# Patient Record
Sex: Male | Born: 1960
Health system: Southern US, Community
[De-identification: ages and names within clinical notes are randomized; demographics above are authoritative.]

## PROBLEM LIST (undated history)

## (undated) DIAGNOSIS — L409 Psoriasis, unspecified: Secondary | ICD-10-CM

## (undated) HISTORY — PX: NOSE SURGERY: SHX723

---

## 2001-03-23 ENCOUNTER — Other Ambulatory Visit: Admission: RE | Admit: 2001-03-23 | Discharge: 2001-03-23 | Payer: Self-pay | Admitting: Dermatology

## 2002-09-12 ENCOUNTER — Ambulatory Visit (HOSPITAL_COMMUNITY): Admission: RE | Admit: 2002-09-12 | Discharge: 2002-09-12 | Payer: Self-pay | Admitting: Internal Medicine

## 2005-12-29 ENCOUNTER — Ambulatory Visit: Payer: Self-pay | Admitting: Orthopedic Surgery

## 2007-04-09 ENCOUNTER — Encounter: Payer: Self-pay | Admitting: Emergency Medicine

## 2007-04-10 ENCOUNTER — Ambulatory Visit: Payer: Self-pay | Admitting: Cardiology

## 2007-04-10 ENCOUNTER — Ambulatory Visit: Payer: Self-pay | Admitting: *Deleted

## 2007-04-10 ENCOUNTER — Observation Stay (HOSPITAL_COMMUNITY): Admission: EM | Admit: 2007-04-10 | Discharge: 2007-04-10 | Payer: Self-pay | Admitting: Cardiology

## 2007-04-11 ENCOUNTER — Ambulatory Visit: Payer: Self-pay

## 2007-04-21 ENCOUNTER — Ambulatory Visit (HOSPITAL_COMMUNITY): Admission: RE | Admit: 2007-04-21 | Discharge: 2007-04-21 | Payer: Self-pay | Admitting: Internal Medicine

## 2007-07-20 ENCOUNTER — Emergency Department (HOSPITAL_COMMUNITY): Admission: EM | Admit: 2007-07-20 | Discharge: 2007-07-21 | Payer: Self-pay | Admitting: Emergency Medicine

## 2008-05-13 ENCOUNTER — Encounter: Payer: Self-pay | Admitting: Internal Medicine

## 2008-05-13 ENCOUNTER — Ambulatory Visit (HOSPITAL_COMMUNITY): Admission: RE | Admit: 2008-05-13 | Discharge: 2008-05-13 | Payer: Self-pay | Admitting: Internal Medicine

## 2008-05-13 ENCOUNTER — Ambulatory Visit: Payer: Self-pay | Admitting: Internal Medicine

## 2008-12-16 IMAGING — CT CT PELVIS W/ CM
1 of 2 series · 14 of 32 positions shown, 19 images · IV contrast (Omnipaque 300)
Comparison: 04/21/2007

CT ABDOMEN

CLINICAL DATA: Epigastric pain.  Left lower quadrant abdominal
pain.

CT ABDOMEN AND PELVIS WITH CONTRAST
TECHNIQUE: Multidetector CT imaging of the abdomen and pelvis was
performed using the standard protocol following bolus
administration of intravenous contrast.
Contrast: 100 ml Mmnipaque-7KK

[Series 2: abd_pel 5.0 b40f · axial · 0.73mm/px · z∈[-459,+6]mm · 14 of 103 slices shown, 19 images]
[im 5/103  soft-tissue]
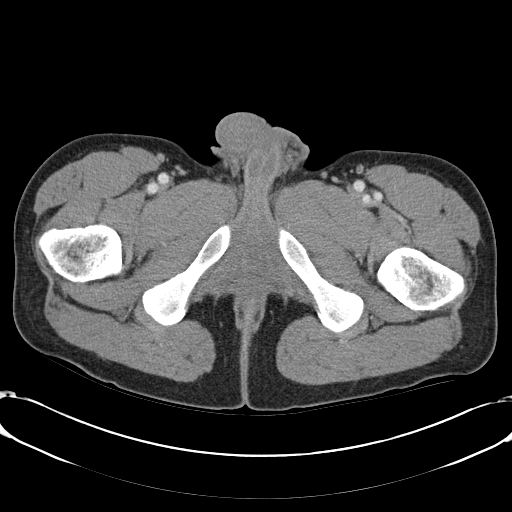
[im 5/103  bone]
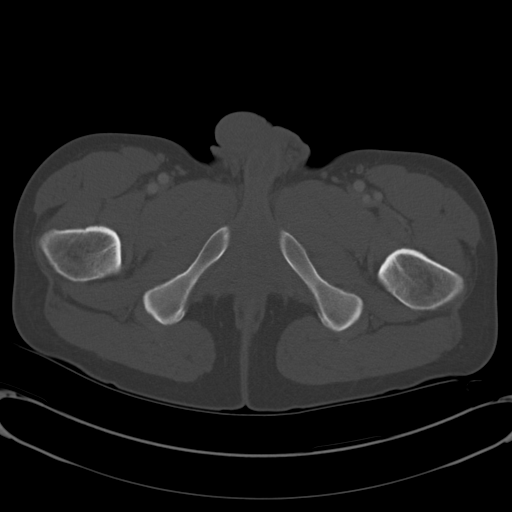
[im 15/103  soft-tissue]
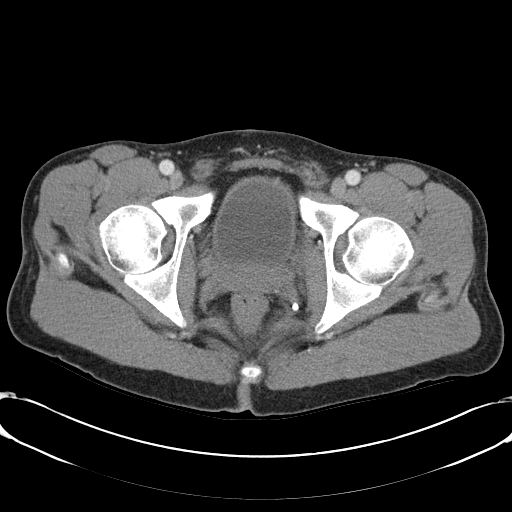
[im 20/103  soft-tissue]
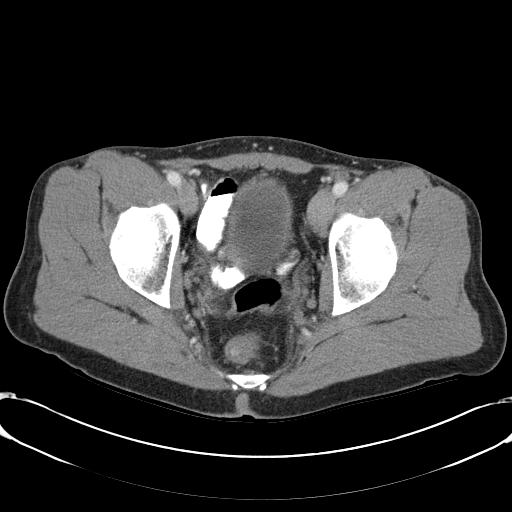
[im 30/103  soft-tissue]
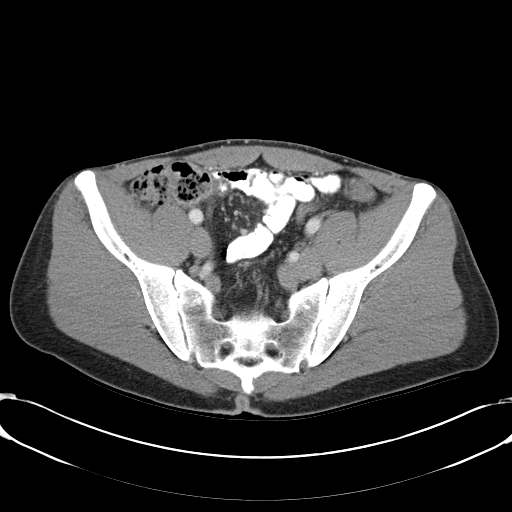
[im 35/103  soft-tissue]
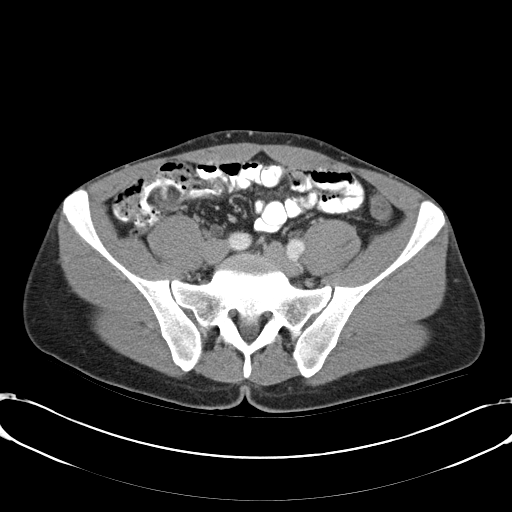
[im 44/103  soft-tissue]
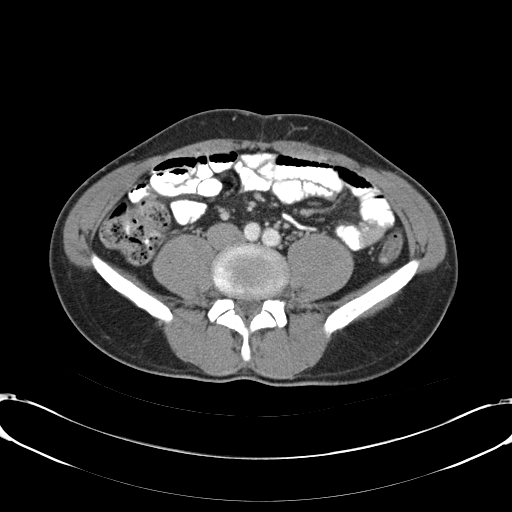
[im 54/103  soft-tissue]
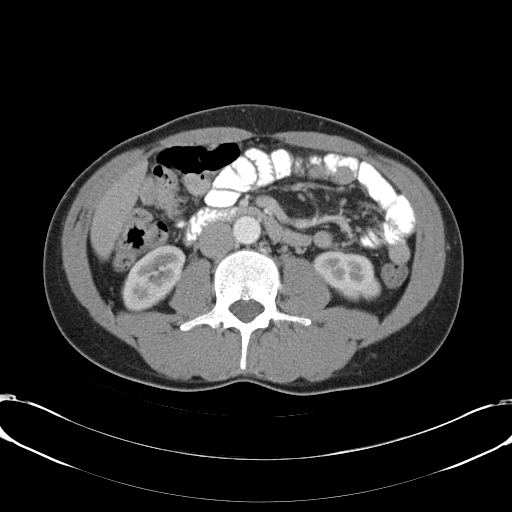
[im 59/103  soft-tissue]
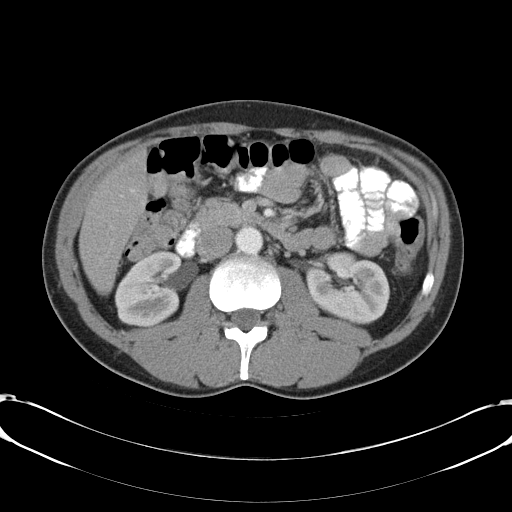
[im 69/103  soft-tissue]
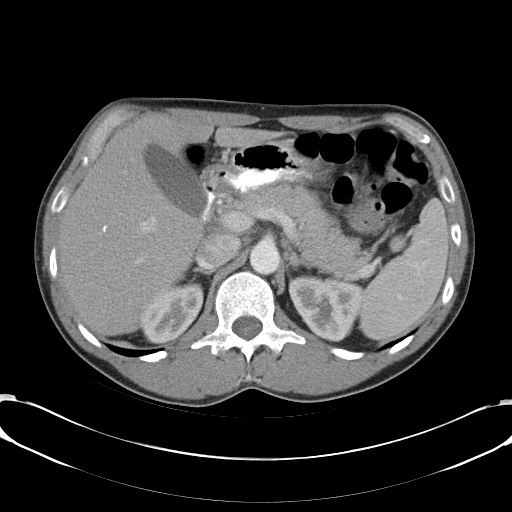
[im 69/103  bone]
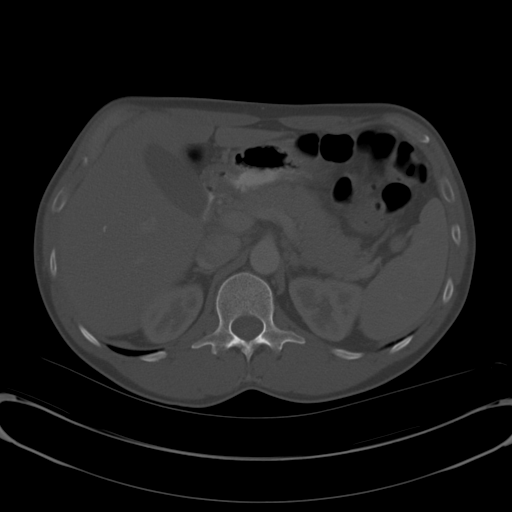
[im 73/103  soft-tissue]
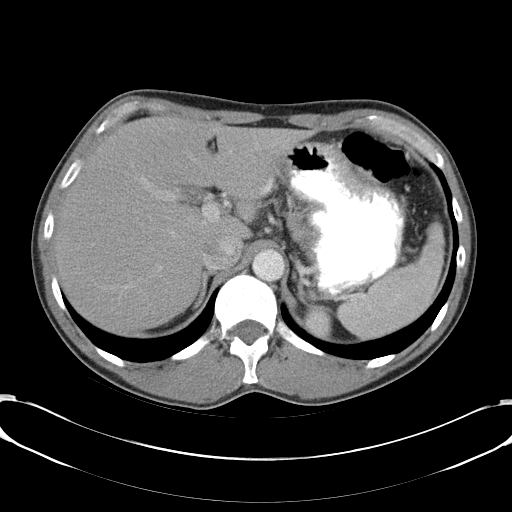
[im 83/103  soft-tissue]
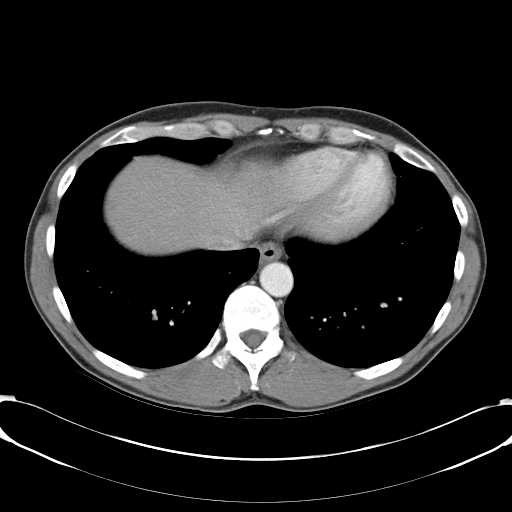
[im 83/103  lung]
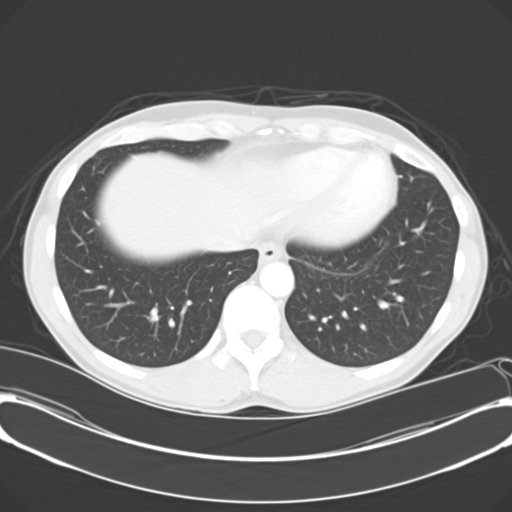
[im 88/103  soft-tissue]
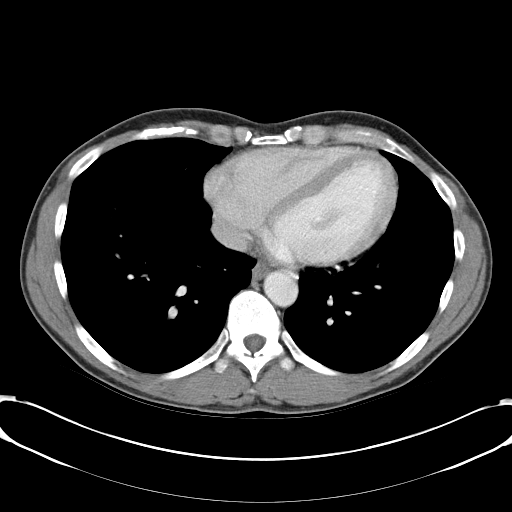
[im 88/103  lung]
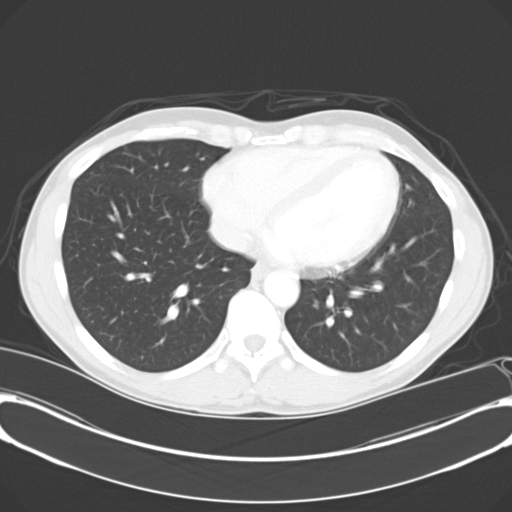
[im 93/103  lung]
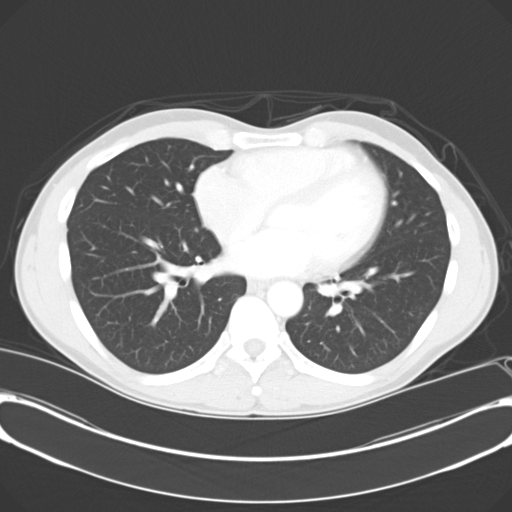
[im 98/103  soft-tissue]
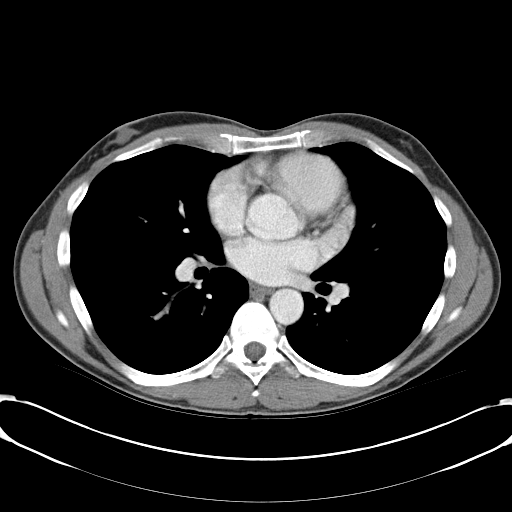
[im 98/103  lung]
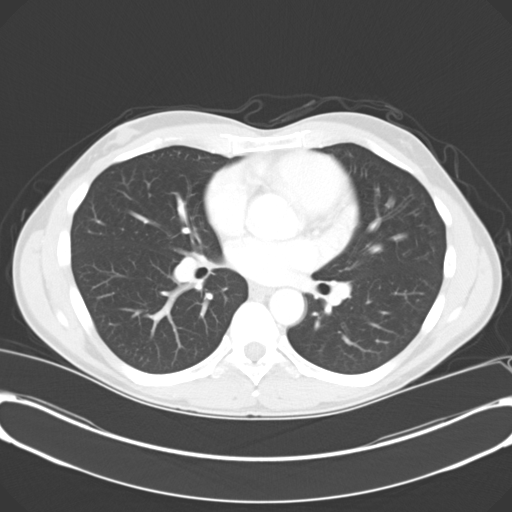

[14 of 32 positions shown; findings below may reference images not displayed]

FINDINGS: A 2 mm pleural-based nodule on image 26 of series 3
appears stable compared to 04/21/2007. Based on current guidelines,
no further workup is warranted.

A stable small calcification of the right hepatic lobe as shown on
image 35 of series 2.  The liver appears otherwise unremarkable.
The gallbladder, spleen, pancreas, and adrenal glands appear
normal.

The kidneys appear normal.  No pathologic retroperitoneal
adenopathy is evident.  The stomach and duodenum appear grossly
unremarkable.

No dilated upper abdominal bowel is noted.  No acute upper
abdominal findings are identified.
IMPRESSION: 1.  No significant upper abdominal abnormalities identified explain
the patient's epigastric and left lower quadrant pain.

CT PELVIS
FINDINGS: The terminal ileum appears grossly unremarkable.
Contrast medium makes its way through to the proximal colon.  Much
of the distal colon is relatively collapsed, limiting assessment
for colonic wall thickening.  No dilated small bowel is identified.

No pathologic inguinal or pelvic adenopathy is identified.
Borderline dilatation of the right mid ureter just proximal to the
iliac crossover is likely physiologic.  Urinary bladder appears
unremarkable.  No free pelvic fluid identified.
IMPRESSION: 1.  No acute pelvic findings to explain the patient's left lower
quadrant abdominal pain.

## 2010-04-20 ENCOUNTER — Encounter: Payer: Self-pay | Admitting: Internal Medicine

## 2010-04-22 ENCOUNTER — Ambulatory Visit (HOSPITAL_COMMUNITY): Admission: RE | Admit: 2010-04-22 | Discharge: 2010-04-22 | Payer: Self-pay | Admitting: Internal Medicine

## 2010-04-22 ENCOUNTER — Ambulatory Visit: Payer: Self-pay | Admitting: Internal Medicine

## 2010-09-22 NOTE — Letter (Signed)
Summary: [facility] ORDERS  [facility] ORDERS   Imported By: Rexene Alberts 04/20/2010 16:29:08  _____________________________________________________________________  External Attachment:    Type:   Image     Comment:   External Document

## 2011-01-05 NOTE — Op Note (Signed)
NAME:  Gary Fischer, Gary A.             ACCOUNT NO.:  [id number]   MEDICAL RECORD NO.:  [id number]         PATIENT TYPE:  PAMB   LOCATION:  DAY                           FACILITY:  [facility]   PHYSICIAN:  R. Roetta Sessions, M.D. DATE OF BIRTH:  28-Jul-1961   DATE OF PROCEDURE:  05/13/2008  DATE OF DISCHARGE:                               OPERATIVE REPORT    ESOPHAGOGASTRODUODENOSCOPY WITH BIOPSY   INDICATIONS FOR PROCEDURE:  A 50 year old gentleman with, I think, a 1-  month history of somewhat vague intermittent upper and lower abdominal  pain, which at times awakens him from a sound sleep.  In the last month,  he has been treated with a round of doxycycline for skin infection and  subsequently was seen by Dr. [name], and then treated with Cipro and  subsequently Levaquin for prostatitis.  He has not had any melena or  blood per rectum.  If anything, he has been a bit more constipated  recently.  He really has not had any nausea, vomiting, odynophagia,  dysphagia, or reflux symptoms.  No NSAID use.  Eating does not affect  the pain and symptoms have insidiously worsened in last several days.   The patient called me on May 10, 2008.  We discussed his symptoms  and I suggested he go on a probiotic in the way of Sustenex.  We  provided him with samples at the office.  I asked him also to cut back  on his intake of caffeinated and carbonated soft drinks, the intake of  which has been significant recently.   He had already started taking Protonix 40 mg orally daily on his own.  I  saw him early in short stay and we obtained blood work including a CBC  and Chem-20, which is completely normal except for slightly elevated  blood sugar of 103.  Amylase and lipase also were normal.  Because of  his ongoing symptoms particularly with the upper abdominal complaint of  being awakened in the middle of the night, we are proceeding with a  diagnostic EGD.  This approach has been reviewed with the  patient at  length.  Risks, benefits, alternatives, and limitations have been  discussed.  Please see the documentation in the medical record.   PROCEDURE NOTE:  O2 saturation, blood pressure, pulse, and respirations  were monitored throughout the entire procedure.  Conscious sedation,  Versed 4 mg IV, Demerol 75 mg IV, Zofran 4 mg IV, and prophylaxis for  postprocedure nausea.  Cetacaine spray for topical pharyngeal  anesthesia.   INSTRUMENT:  Pentax video chip system.   FINDINGS:  Examination of the tubular esophagus revealed no mucosal  abnormalities.  EG junction was easily traversed.  Stomach:  Gastric  cavity was completely empty and insufflated very well with air.  Thorough examination of gastric mucosa including retroflexion at the  proximal stomach and esophagogastric junction demonstrated a small  hiatal hernia and a couple of areas of very, very fine petechiae in the  antrum and body.  There was no ulcer, erosion, infiltrating process, or  other evidence of neoplasm.  Pylorus  was patent and easily traversed.  Examination of the bulb, second portion revealed no abnormalities.  Therapeutic/diagnostic maneuvers performed:  Single biopsy of the antrum  and one of the body were taken for histologic study/to rule out H.  pylori.  The patient tolerated the procedure well and was reactive in  Endoscopy.   IMPRESSION:  Normal esophagus, small hiatal hernia, area of minimal fine  petechiae in the antrum and body of very doubtful clinical significance;  otherwise unremarkable gastric mucosa, status post biopsy, patent  pylorus, and normal D1 and D2.   I see no explanation for this patient's symptoms based on today's  endoscopic evaluation.   RECOMMENDATIONS:  1. We will go ahead and proceed with an abdominal and pelvic CT with      IV and oral contrast to further evaluate, and  follow up on      biopsies.  2. Further recommendations to follow.      Jonathon Bellows, M.D.   Electronically Signed     RMR/MEDQ  D:  05/13/2008  T:  05/14/2008  Job:  [id number]   cc:   Kingsley Callander. Ouida Sills, MD  Fax: [phone]

## 2011-01-05 NOTE — Discharge Summary (Signed)
NAMEARMANDO, Gary Fischer                ACCOUNT NO.:  [id number]   MEDICAL RECORD NO.:  [id number]          PATIENT TYPE:  OBV   LOCATION:  2008                         FACILITY:  [facility]   PHYSICIAN:  Rollene Rotunda, MD, FACCDATE OF BIRTH:  [birth year]   DATE OF ADMISSION:  04/10/2007  DATE OF DISCHARGE:  04/10/2007                               DISCHARGE SUMMARY   PROCEDURE:  Two-dimensional echocardiogram.   DISCHARGE DIAGNOSES:  1. Chest pain, cardiac enzymes negative for myocardial infarction and      outpatient stress test pending.  2. Recent flu-like illness, currently undergoing treatment for [facility] Spotted Fever with titers for Lyme disease, Ehrlichia and      [place] Spotted Fever pending.  3. Unknown lipid status, no lipid profile performed this admission.   Time of discharge 36 minutes.   HOSPITAL COURSE:  [name] is a 50 year old male with no previous  history of coronary artery disease.  He had an episode of chest pain on  the day of admission and went to [facility] where there was  concern for a cardiac origin of his symptoms, so he was transferred to  [facility] and admitted.   His cardiac enzymes were negative for MI.  A TSH was within normal  limits at 5.375.  A nonfasting blood sugar was minimally elevated to  105.  D-dimer was within normal limits at 0.22.  Other laboratory values  including a CBC and CMET were all within normal limits.   The chest pain resolved.  An echocardiogram showed an EF of 55% with no  regional wall motion abnormalities and normal wall thickness.  PAS was  17.  The aortic root was at the upper limits of normal in size.  [name] was evaluated by Dr. Antoine Poche and considered stable for discharge  with an outpatient stress test arranged.   ACTIVITY:  His activity level is to be increased gradually.   FOLLOW UP:  He is to follow up in our office on August 19, at 12:30  p.m., with a stress test and with Dr.  Antoine Poche as needed.   DISCHARGE MEDICATIONS:  1. Doxycycline as prior to admission.  2. Aspirin 81 mg a day.      Theodore Demark, PA-C      Rollene Rotunda, MD, [facility]  Electronically Signed    RB/MEDQ  D:  04/10/2007  T:  04/11/2007  Job:  [id number]   cc:   Kingsley Callander. Ouida Sills, MD

## 2011-01-05 NOTE — H&P (Signed)
NAMEJAHMEL, FLANNAGAN NO.:  [id number]   MEDICAL RECORD NO.:  [id number]          PATIENT TYPE:  INP   LOCATION:  2008                         FACILITY:  [facility]   PHYSICIAN:  Satira Anis, MDDATE OF BIRTH:  1961-02-09   DATE OF ADMISSION:  04/10/2007  DATE OF DISCHARGE:  04/10/2007                              HISTORY & PHYSICAL   HISTORY OF PRESENT ILLNESS:  The patient is a 50 year old male, who  presents with a complaint of chest discomfort.  He had flu-like symptoms  about 1 week ago.  He took doxycycline for folliculitis.  He had night  sweats 3 nights ago, but never recorded any temperature.  He said he had  episodic chest pain lasting to a few seconds to a few minutes.  He does  have a rigorous exercise regimen, which does not provoke any chest pain.  He claims the pain to be aching in nature.  He does not offer any other  complaints of shortness of breath, calf swelling or palpitations.   PAST MEDICAL HISTORY:  His past medical is otherwise unremarkable.   SOCIAL HISTORY:  No alcohol drinking or drug abuse.   ALLERGIES:  None.   REVIEW OF SYMPTOMS:  CONSTITUTIONAL:  Negative.  EYES:  Negative.  EARS, NOSE, THROAT, MOUTH:  Negative.  CARDIOVASCULAR:  As mentioned in the history of present illness.  RESPIRATORY:  Negative.  GASTROINTESTINAL:  Negative.  MUSCULOSKELETAL:  Negative.  SKIN:  Negative.  NEUROLOGIC:  Negative.  PSYCHIATRIC:  Negative.   FAMILY HISTORY:  Not significant for coronary artery disease,  hypertension or stroke.   PHYSICAL EXAMINATION:  VITAL SIGNS:  Blood pressure is 110/70,  respiratory rate is 12, temperature is 98.6  .  HEAD AND NECK EXAMINATION:  Anicteric sclerae.  No pallor of the  conjunctivae.  Mucous membranes are moist.  JVP is normal.  Carotid  upstroke is briskly normal.  Thyroid is normal in size.  Trachea is  midline.  CHEST:  Symmetric, equal expansion of breath sounds.  CARDIOVASCULAR:  S1, S2  regular with no murmurs or gallops.  Abdomen is soft.  No tenderness.  No hepatosplenomegaly.  EXTREMITIES:  No edema.  Normal pulses.  NEUROVASCULAR:  Nonfocal with intact cognition.   LABORATORY DATA:  Sodium 139, potassium 3.6, glucose 105, BUN  8,  creatinine 1.02.  LFTs normal.  WBC is 5000.  Hemoglobin 13.8 gram/dL,  hematocrit 42% and platelet count is 248,000.   Electrocardiogram shows normal sinus rhythm with mild first degree AV  block of 210 milliseconds and QRS duration of 90 milliseconds and RV  conduction delay pattern.   ASSESSMENT AND PLAN:  An echocardiogram will be obtained in the morning.  Chest pain relieved with morphine for now.  Unlikely that this  constitutes any one of the three  acute chest emergencies, that is  including the acute coronary syndrome, aortic dissection  or PE.  Dr.  Gala Romney to assume care from now on.      Satira Anis, MD  Electronically Signed     RN/MEDQ  D:  04/10/2007  T:  04/10/2007  Job:  [id number]

## 2011-01-08 NOTE — Op Note (Signed)
NAME:  Gary Fischer, Gary Fischer                          ACCOUNT NO.:  [id number]   MEDICAL RECORD NO.:  [id number]                   PATIENT TYPE:  AMB   LOCATION:  DAY                                  FACILITY:  [facility]   PHYSICIAN:  R. Roetta Sessions, M.D.              DATE OF BIRTH:  31-Jan-1961   DATE OF PROCEDURE:  09/12/2002  DATE OF DISCHARGE:                                 OPERATIVE REPORT   PROCEDURE:  Diagnostic colonoscopy.   ENDOSCOPIST:  Gerrit Friends. Rourk, M.D.   INDICATIONS FOR PROCEDURE:  The patient is a 50 year old gentleman who  reports a small volume gross blood per rectum in the setting of having a  rather large bowel movement several weeks ago.  He has not had any  associated abdominal pain.  He has not seen any blood per rectum since.  He  has not seen any blood prior to that per his report.  Family history is  significant in that his paternal grandmother had colorectal cancer at an  advanced age.  Colonoscopy is now being done to further evaluate  hematochezia.  This approach has been discussed with the patient.  The  potential risks, benefits, and alternatives have been reviewed, questions  answered.  The patient is agreeable. ASA 1.   DESCRIPTION OF PROCEDURE:  O2 saturation, blood pressure, pulse and  respirations were monitored throughout the entire procedure.   MONITORING:  Versed 3 mg IV, Demerol 50 mg IV in divided doses.   INSTRUMENT:  Olympus video chip colonoscope.   FINDINGS:  Digital rectal exam revealed no abnormalities.  He did have some  psoriatic lesions over his coccygeal area.   ENDOSCOPIC FINDINGS:  The prep was good.   RECTUM:  Examination of the rectal mucosa including the retroflex view of  the anal verge en face review of the anal canal revealed no mucosal  abnormalities.   COLON:  The colonic mucosa was surveyed from the rectosigmoid junction  through the left transverse and right colon to the area of the appendiceal  orifice, ileocecal  valve, and cecum.  These structures were well seen and  photographed for the record.  The colonic mucosa all the way to the cecum  appeared normal to the cecum.  The terminal ileum was intubated to 10 cm.  This segment of the GI tract also appeared normal.   From this level the scope was slowly and cautiously withdrawn.  All  previously mentioned mucosal surfaces were again seen; and, again, no  abnormalities were observed.  The patient tolerated the procedure well and  was reacted in endoscopy.   IMPRESSION:  1. Normal rectum.  2. Normal colon.  3. Normal terminal ileum.   DISCUSSION:  I suspect that the patient may have sustained a small rectal  tear in the setting of passing a large bowel movement to account for his  symptoms.   RECOMMENDATIONS:  1.  Repeat colonoscopy at age 26.  2. Maintain a relatively high fiber, low fat diet.  3. The patient is to call me if he has any future GI problems.                                               Jonathon Bellows, M.D.    RMR/MEDQ  D:  09/12/2002  T:  09/12/2002  Job:  [id number]

## 2011-05-03 ENCOUNTER — Encounter: Payer: Self-pay | Admitting: Internal Medicine

## 2011-05-03 ENCOUNTER — Ambulatory Visit (INDEPENDENT_AMBULATORY_CARE_PROVIDER_SITE_OTHER): Payer: 59 | Admitting: Internal Medicine

## 2011-05-03 VITALS — BP 132/75 | HR 66 | Temp 98.6°F | Ht 73.0 in | Wt 168.4 lb

## 2011-05-03 DIAGNOSIS — R1013 Epigastric pain: Secondary | ICD-10-CM

## 2011-05-03 DIAGNOSIS — K3189 Other diseases of stomach and duodenum: Secondary | ICD-10-CM

## 2011-05-03 NOTE — Patient Instructions (Signed)
Continue protonix 40 mg orally daily  Last intake of food  2 hours before going to bed.  Recheck in either by phone or in the office in 4 weeks

## 2011-05-03 NOTE — Assessment & Plan Note (Addendum)
Pleasant 50 year old gentleman 3 to four-week history of the finger intermittent epigastric left upper quadrant abdominal pain. It has awoke him from sleep on 2 occasions but only lasted for a few minutes. At other times he just notices it. It's not associated with eating or having a bowel movement. No alarm symptoms at this time. Stools Hemoccult negative. Similar symptom flare back in 2009. Endoscopic and radiographic evaluation a couple years ago for similar symptoms as outlined above.  Symptoms overall would be atypical for acid peptic disease.  I suspect we're dealing with dyspepsia with a without a component of reflux versus an element of musculoskeletal pain I do feel he is taking caffeinated carbonated beverages in excess. And needs to improve on his eating habit.  I doubt would be an occult biliary tract pathology at this time.   Recommendations:  Empirically treat with protonix 40 mg orally daily for the next 4 weeks.  Reviewed antireflux diet with patient;  I recommended he cut down on caffeinated carbonated beverages and to arrange for his last intake of food to be a full 2 hours before he goes to bed.  Will reevaluate in one month. If he is not markedly improved, then we will embark on further investigation.

## 2011-05-03 NOTE — Progress Notes (Signed)
Primary Care Physician:  [name], MD Primary Gastroenterologist:  Dr.   Pre-Procedure History & Physical: HPI:  Gary Fischer is a 50 y.o. male here for evaluation three-week history of vague epigastric medial left upper quadrant abdominal pain. Symptoms in association with some increased stress in work load in his office. On 2 occasions he has been awakened from sleep with this discomfort. He gets up and walks around within several minutes pain subsides. He Patient states he's been doing quite a bit more yard work outside and has been exerting himself more than usual. He runs 4-5 miles weekly and doesn't have any pain with that form of exertion either. He states he had some vulvar tonics and started taking it recently but got a new prescription today and just started on Protonix 40 mg orally daily. He reports his annual Dr. day lab work looked good from last spring.  Hasn't had any typical reflux symptoms. No associated nausea or vomiting. He does have a habit of eating a bowl of milk and cereal just before going to bed. Also he reports multiple caffeinated carbonated beverage beverages daily.  Essentially nil alcohol.intake.  No NSAID use. No melena or rectal bleeding no constipation or diarrhea. Colonoscopy in 2011 demonstrated a couple of a sending diverticula (brother with tubulovillous adenoma). He denies dysphagia or abdominal dysphagia. EGD in 2009 for similar symptoms demonstrated mild gaseous gastric arrangements. No lower serologies negative. CT in 2009 demonstrated no significant abnormality aside from stable 2 mm pulmonary nodule which for no further by the radiologist was recommended.  He really has not lost any weight.  Patient denies nonsteroidal agents peer  Past medical history history of psoriasis followed by Dr. Suan Halter. Patient had a cardiac evaluation a couple of years ago was found to have a benign flow murmur.  No cardiac disease reported. No past surgical history on  file.  Prior to Admission medications   Not on File    Allergies as of 05/03/2011 - Review Complete 05/03/2011  Allergen Reaction Noted  . Other  05/03/2011    No family history on file.  History   Social History  . Marital Status: Married    Spouse Name: N/A    Number of Children: N/A  . Years of Education: N/A   Occupational History  . Not on file.   Social History Main Topics  . Smoking status: Never Smoker   . Smokeless tobacco: Not on file  . Alcohol Use: No  . Drug Use: No  . Sexually Active: Not on file   Other Topics Concern  . Not on file   Social History Narrative  . No narrative on file    Review of Systems: See HPI, otherwise negative ROS  Physical Exam: BP 132/75  Pulse 66  Temp(Src) 98.6 F (37 C) (Temporal)  Ht 6\' 1"  (1.854 m)  Wt 168 lb 6.4 oz (76.386 kg)  BMI 22.22 kg/m2 General:   Alert,  Well-developed, well-nourished, pleasant and cooperative in NAD Head:  Normocephalic and atraumatic. Eyes:  Sclera clear, no icterus.   Conjunctiva pink. Ears:  Normal auditory acuity. Nose:  No deformity, discharge,  or lesions. Mouth:  No deformity or lesions, dentition normal. Neck:  Supple; no masses or thyromegaly. Lungs:  Clear throughout to auscultation.   No wheezes, crackles, or rhonchi. No acute distress. Heart:  Regular rate and rhythm; patient has a 1-2/6 systolic ejection murmur; no click or rubs,  or gallops. Abdomen:  Soft, nontender and nondistended. No  masses, hepatosplenomegaly or hernias noted. Normal bowel sounds, without guarding, and without rebound.   Msk:  Symmetrical without gross deformities. Normal posture. Pulses:  Normal pulses noted. Extremities:  Without clubbing or edema. Neurologic:  Alert and  oriented x4;  grossly normal neurologically. Skin:  Intact without significant lesions or rashes. Cervical Nodes:  No significant cervical adenopathy. Psych:  Alert and cooperative. Normal mood and affect. Rectal exam: No  external lesions. Sphincter time. No rectal masses scant brown stool Hemoccult negative. Prostate smooth symmetrical without nodularity

## 2011-05-05 ENCOUNTER — Ambulatory Visit: Payer: Self-pay | Admitting: Gastroenterology

## 2011-05-24 LAB — COMPREHENSIVE METABOLIC PANEL
ALT: 19
AST: 22
Calcium: 9.2
Creatinine, Ser: 0.93
GFR calc Af Amer: >60
Sodium: 136
Total Protein: 7.3

## 2011-05-24 LAB — DIFFERENTIAL
Basophils Relative: 0
Eosinophils Relative: 1
Metamyelocytes Relative: 0
Monocytes Relative: 6
Myelocytes: 0
Neutrophils Relative %: 62

## 2011-05-24 LAB — CBC
MCHC: 35.3
RDW: 12.2

## 2011-06-01 LAB — DIFFERENTIAL
Basophils Absolute: 0
Basophils Relative: 0
Eosinophils Absolute: 0 — ABNORMAL LOW
Eosinophils Relative: 0
Lymphs Abs: 0.6 — ABNORMAL LOW
Neutrophils Relative %: 88 — ABNORMAL HIGH

## 2011-06-01 LAB — COMPREHENSIVE METABOLIC PANEL
ALT: 27
AST: 29
Alkaline Phosphatase: 66
CO2: 27
Calcium: 9.4
Chloride: 101
GFR calc Af Amer: >60
GFR calc non Af Amer: >60
Glucose, Bld: 117 — ABNORMAL HIGH
Sodium: 135
Total Bilirubin: 1.1

## 2011-06-01 LAB — CBC
Hemoglobin: 14.8
MCHC: 34.5
RBC: 4.66
WBC: 7.2

## 2011-06-04 LAB — EHRLICHIA ANTIBODY PANEL
E chaffeensis (HGE) Ab, IgM: <1:20 {titer}
Interpretation-ECHAB: NOT DETECTED

## 2011-06-04 LAB — DIFFERENTIAL
Basophils Absolute: 0
Lymphocytes Relative: 41
Lymphs Abs: 2
Monocytes Absolute: 0.5
Neutro Abs: 2.4

## 2011-06-04 LAB — B. BURGDORFI ANTIBODIES: B burgdorferi Ab IgG+IgM: 0.2

## 2011-06-04 LAB — COMPREHENSIVE METABOLIC PANEL
AST: 25
Albumin: 4.3
BUN: 8
Calcium: 9.3
Chloride: 103
Creatinine, Ser: 1.02
GFR calc Af Amer: >60
GFR calc non Af Amer: >60
Total Bilirubin: 0.5

## 2011-06-04 LAB — POCT CARDIAC MARKERS
Myoglobin, poc: 56.1
Operator id: 189501

## 2011-06-04 LAB — CBC
HCT: 40
MCHC: 34.5
MCV: 91.7
Platelets: 248
RDW: 12.5
WBC: 5

## 2011-06-04 LAB — D-DIMER, QUANTITATIVE: D-Dimer, Quant: 0.22

## 2011-06-04 LAB — CARDIAC PANEL(CRET KIN+CKTOT+MB+TROPI)
CK, MB: 2
Relative Index: 1.3

## 2012-11-02 ENCOUNTER — Ambulatory Visit (INDEPENDENT_AMBULATORY_CARE_PROVIDER_SITE_OTHER): Payer: 59 | Admitting: Otolaryngology

## 2013-01-04 ENCOUNTER — Ambulatory Visit (INDEPENDENT_AMBULATORY_CARE_PROVIDER_SITE_OTHER): Payer: 59 | Admitting: Otolaryngology

## 2013-01-11 ENCOUNTER — Ambulatory Visit (INDEPENDENT_AMBULATORY_CARE_PROVIDER_SITE_OTHER): Payer: 59 | Admitting: Otolaryngology

## 2013-01-11 DIAGNOSIS — D3709 Neoplasm of uncertain behavior of other specified sites of the oral cavity: Secondary | ICD-10-CM

## 2013-01-30 ENCOUNTER — Other Ambulatory Visit: Payer: Self-pay | Admitting: Family Medicine

## 2013-01-30 LAB — LIPID PANEL
Cholesterol: 180 mg/dL (ref 0–200)
HDL: 55 mg/dL (ref >39)
Total CHOL/HDL Ratio: 3.3 Ratio
Triglycerides: 71 mg/dL (ref <150)
VLDL: 14 mg/dL (ref 0–40)

## 2013-01-30 LAB — CBC WITH DIFFERENTIAL/PLATELET
HCT: 40.9 % (ref 39.0–52.0)
Hemoglobin: 14.7 g/dL (ref 13.0–17.0)
Lymphocytes Relative: 38 % (ref 12–46)
Lymphs Abs: 1.3 10*3/uL (ref 0.7–4.0)
Monocytes Relative: 11 % (ref 3–12)
Neutro Abs: 1.6 10*3/uL — ABNORMAL LOW (ref 1.7–7.7)
Neutrophils Relative %: 47 % (ref 43–77)
RBC: 4.63 MIL/uL (ref 4.22–5.81)

## 2013-01-30 LAB — HEPATITIS C ANTIBODY, REFLEX: HCV Ab: NEGATIVE

## 2013-01-31 ENCOUNTER — Encounter: Payer: Self-pay | Admitting: Family Medicine

## 2013-11-15 ENCOUNTER — Encounter: Payer: Self-pay | Admitting: Family Medicine

## 2014-06-17 ENCOUNTER — Other Ambulatory Visit: Payer: Self-pay | Admitting: *Deleted

## 2014-06-17 DIAGNOSIS — E785 Hyperlipidemia, unspecified: Secondary | ICD-10-CM

## 2014-06-18 LAB — LIPID PANEL
CHOL/HDL RATIO: 4.2 ratio
Cholesterol: 179 mg/dL (ref 0–200)
HDL: 43 mg/dL (ref >39)
LDL Cholesterol: 122 mg/dL — ABNORMAL HIGH (ref 0–99)
Triglycerides: 72 mg/dL (ref <150)
VLDL: 14 mg/dL (ref 0–40)

## 2014-06-19 NOTE — Progress Notes (Signed)
Dr Keyonta notified. 

## 2014-07-04 ENCOUNTER — Ambulatory Visit (INDEPENDENT_AMBULATORY_CARE_PROVIDER_SITE_OTHER): Payer: 59 | Admitting: Otolaryngology

## 2014-07-04 ENCOUNTER — Other Ambulatory Visit (HOSPITAL_COMMUNITY)
Admission: RE | Admit: 2014-07-04 | Discharge: 2014-07-04 | Disposition: A | Discharge status: Home / Self Care | Payer: 59 | Source: Ambulatory Visit | Attending: Otolaryngology | Admitting: Otolaryngology

## 2014-07-04 DIAGNOSIS — D3704 Neoplasm of uncertain behavior of the minor salivary glands: Secondary | ICD-10-CM

## 2014-07-04 DIAGNOSIS — D210 Benign neoplasm of connective and other soft tissue of head, face and neck: Secondary | ICD-10-CM | POA: Diagnosis not present

## 2014-07-04 DIAGNOSIS — R220 Localized swelling, mass and lump, head: Secondary | ICD-10-CM | POA: Diagnosis present

## 2014-07-31 ENCOUNTER — Encounter: Payer: Self-pay | Admitting: Family Medicine

## 2014-08-29 ENCOUNTER — Other Ambulatory Visit: Payer: Self-pay

## 2014-08-29 MED ORDER — PROMETHAZINE HCL 25 MG PO TABS: 25.0000 mg | ORAL_TABLET | Freq: Two times a day (BID) | ORAL | Status: DC | PRN

## 2014-10-31 ENCOUNTER — Other Ambulatory Visit: Payer: Self-pay

## 2014-12-27 ENCOUNTER — Other Ambulatory Visit: Payer: Self-pay

## 2014-12-27 MED ORDER — DOXYCYCLINE HYCLATE 100 MG PO TABS: 100.0000 mg | ORAL_TABLET | Freq: Two times a day (BID) | ORAL | Status: DC

## 2015-01-08 ENCOUNTER — Encounter: Payer: Self-pay | Admitting: Cardiovascular Disease

## 2015-01-08 ENCOUNTER — Ambulatory Visit (INDEPENDENT_AMBULATORY_CARE_PROVIDER_SITE_OTHER): Payer: 59 | Admitting: Cardiovascular Disease

## 2015-01-08 ENCOUNTER — Ambulatory Visit (HOSPITAL_COMMUNITY)
Admission: RE | Admit: 2015-01-08 | Discharge: 2015-01-08 | Disposition: A | Discharge status: Home / Self Care | Payer: 59 | Source: Ambulatory Visit | Attending: Cardiovascular Disease | Admitting: Cardiovascular Disease

## 2015-01-08 VITALS — BP 118/78 | HR 58 | Ht 74.0 in | Wt 169.4 lb

## 2015-01-08 DIAGNOSIS — R002 Palpitations: Secondary | ICD-10-CM | POA: Insufficient documentation

## 2015-01-08 MED ORDER — METOPROLOL TARTRATE 25 MG PO TABS
12.5000 mg | ORAL_TABLET | ORAL | Status: DC | PRN
Start: 2015-01-08 — End: 2016-01-07

## 2015-01-08 NOTE — Patient Instructions (Signed)
Your physician recommends that you schedule a follow-up appointment in: 6 weeks with Dr. Bronson Ing  Your physician has requested that you have an exercise tolerance test. For further information please visit [email or web address]. Please also follow instruction sheet, as given.  Your physician has recommended that you wear a holter monitor. Holter monitors are medical devices that record the heart's electrical activity. Doctors most often use these monitors to diagnose arrhythmias. Arrhythmias are problems with the speed or rhythm of the heartbeat. The monitor is a small, portable device. You can wear one while you do your normal daily activities. This is usually used to diagnose what is causing palpitations/syncope (passing out).  Start Metoprolol 12.5 mg as needed for palpitations   Thank you for choosing [facility]!

## 2015-01-08 NOTE — Progress Notes (Signed)
Patient ID: Gary Fischer, male   DOB: 1961/07/11, 54 y.o.   MRN: [id number]       CARDIOLOGY CONSULT NOTE  Patient ID: Gary Fischer MRN: [id number] DOB/AGE: Jan 22, 1961 54 y.o.  Admit date: (Not on file) Primary Physician Asencion Noble, MD  Reason for Consultation: palpitations  HPI: The patient is a 54 year old male with no significant past medical history, who presents for the evaluation of palpitations. 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  Starting this past weekend, he began experiencing at least two hours of episodic palpitations which occurred in the evening. He had a similar experience the following night, and had about 1.5 hours of palpitations this past Monday evening and less so last night. They are not associated with chest pain, dizziness, presyncope, or shortness of breath. He has no leg swelling. He notices a palpitation followed by a short pause with a slightly lower heart rate with it gradually increasing to the high 50 to low 60 bpm range. He is a recreational runner and runs roughly 2-3 miles at least four days a week without any limitations. He denies exertional chest discomfort. He has no history of thyroid abnormalities nor hypertension. He had been drinking Diet Coke more frequently but has since significantly reduced caffeine consumption.  They have been more of a nuisance to him and he wants to be certain he can continue to run recreationally without concern for a major adverse cardiovascular event.  A review of labs performed on 11/27/14 shows normal renal function, TSH normal at 2.9, normal Hgb and PSA, and lipid panel demonstrating TC 185, TG 103, HDL 47, LDL 117.  ECG performed in the office today demonstrates sinus bradycardia, heart rate 57 bpm, first-degree AV block with PR interval 240 ms, and an incomplete right bundle-branch block, QRS duration 116 ms.  Soc: Originally from [place], [place]. Married (wife has a Building services engineer). Two daughters, the  eldest who will be attending the [employer] this fall.  Allergies  Allergen Reactions  . Other     Hazelnut     Current Outpatient Prescriptions  Medication Sig Dispense Refill  . Apremilast 30 MG TABS Take by mouth 2 (two) times daily.    Marland Kitchen doxycycline (VIBRA-TABS) 100 MG tablet Take 1 tablet (100 mg total) by mouth 2 (two) times daily. 28 tablet 0  . promethazine (PHENERGAN) 25 MG tablet Take 1 tablet (25 mg total) by mouth 2 (two) times daily as needed for nausea or vomiting. 12 tablet 0   No current facility-administered medications for this visit.    No past medical history on file.  No past surgical history on file.  History   Social History  . Marital Status: Married    Spouse Name: N/A  . Number of Children: N/A  . Years of Education: N/A   Occupational History  . Not on file.   Social History Main Topics  . Smoking status: Never Smoker   . Smokeless tobacco: Not on file  . Alcohol Use: No  . Drug Use: No  . Sexual Activity: Not on file   Other Topics Concern  . Not on file   Social History Narrative     No family history of premature CAD in 1st degree relatives.  Prior to Admission medications   Medication Sig Start Date End Date Taking? Authorizing Provider  doxycycline (VIBRA-TABS) 100 MG tablet Take 1 tablet (100 mg total) by mouth 2 (two) times daily. 12/27/14   Grace Bushy  Wolfgang Phoenix, MD  promethazine (PHENERGAN) 25 MG tablet Take 1 tablet (25 mg total) by mouth 2 (two) times daily as needed for nausea or vomiting. 08/29/14   Mikey Kirschner, MD     Review of systems complete and found to be negative unless listed above in HPI     Physical exam Blood pressure 118/78, pulse 58, height 6\' 2"  (1.88 m), weight 169 lb 6.4 oz (76.839 kg), SpO2 98 %. General: NAD Neck: No JVD, no obvious thyromegaly.  Lungs: Clear to auscultation bilaterally with normal respiratory effort. CV: Regular rate with mostly regular rhythm with premature contractions  noted followed by a compensatory pause, normal S1/S2, no S3/S4, no murmur.  No peripheral edema.   Abdomen: No distention.  Skin: Intact without lesions or rashes.  Neurologic: Alert and oriented x 3.  Psych: Normal affect. Extremities: No clubbing or cyanosis.  HEENT: No obvious abnormalities.   Labs:   Lab Results  Component Value Date   WBC 3.3* 01/30/2013   HGB 14.7 01/30/2013   HCT 40.9 01/30/2013   MCV 88.3 01/30/2013   PLT 236 01/30/2013   No results for input(s): NA, K, CL, CO2, BUN, CREATININE, CALCIUM, PROT, BILITOT, ALKPHOS, ALT, AST, GLUCOSE in the last 168 hours.  Invalid input(s): LABALBU Lab Results  Component Value Date   CKTOTAL 152 04/10/2007   CKMB 2.0 04/10/2007   TROPONINI 0.01        NO INDICATION OF MYOCARDIAL INJURY. 04/10/2007    Lab Results  Component Value Date   CHOL 179 06/18/2014   CHOL 180 01/30/2013   Lab Results  Component Value Date   HDL 43 06/18/2014   HDL 55 01/30/2013   Lab Results  Component Value Date   LDLCALC 122* 06/18/2014   LDLCALC 111* 01/30/2013   Lab Results  Component Value Date   TRIG 72 06/18/2014   TRIG 71 01/30/2013   Lab Results  Component Value Date   CHOLHDL 4.2 06/18/2014   CHOLHDL 3.3 01/30/2013   No results found for: LDLDIRECT       Studies: No results found.  ASSESSMENT AND PLAN:  1. Palpitations: He may be experiencing symptomatic PAC's or PVC's. No thyroid abnormalities and normal electrolyte levels. No murmurs appreciated to warrant obtaining an echocardiogram at this time.  I will obtain a 48-hour Holter monitor to evaluate for symptomatic premature contractions. Symptoms do not indicate he is having sustained paroxysms of a supraventricular arrhythmia. I will prescribe 12.5 mg metoprolol prn. I will also obtain a plain exercise treadmill stress test to see if an adrenergic response provokes more sustained arrhythmias, so as to make certain he can continue to run recreationally without  concern. He is in agreement with this plan.  Dispo: f/u 6 weeks.   Signed: Kate Sable, M.D., F.A.C.C.  01/08/2015, 12:02 PM

## 2015-01-29 ENCOUNTER — Other Ambulatory Visit: Payer: Self-pay | Admitting: Cardiovascular Disease

## 2015-01-29 ENCOUNTER — Ambulatory Visit (HOSPITAL_COMMUNITY)
Admission: RE | Admit: 2015-01-29 | Discharge: 2015-01-29 | Disposition: A | Discharge status: Home / Self Care | Payer: 59 | Source: Ambulatory Visit | Attending: Cardiovascular Disease | Admitting: Cardiovascular Disease

## 2015-01-29 DIAGNOSIS — R002 Palpitations: Secondary | ICD-10-CM | POA: Diagnosis not present

## 2015-01-29 LAB — EXERCISE TOLERANCE TEST
CHL CUP RESTING HR STRESS: 54 {beats}/min
Estimated workload: 18.7 METS
Exercise duration (min): 15 min
Exercise duration (sec): 31 s
MPHR: 167 {beats}/min
Peak HR: 151 {beats}/min
Percent HR: 90 %

## 2015-03-25 ENCOUNTER — Encounter: Payer: Self-pay | Admitting: Internal Medicine

## 2015-06-04 ENCOUNTER — Encounter: Payer: Self-pay | Admitting: Family Medicine

## 2015-09-01 ENCOUNTER — Other Ambulatory Visit: Payer: Self-pay

## 2015-09-01 MED ORDER — DOXYCYCLINE HYCLATE 100 MG PO TABS: ORAL_TABLET | ORAL | Status: DC

## 2015-09-08 MED FILL — OTEZLA 30 MG TABS: 30 | 30 days supply | Qty: 60 | Fill #0

## 2015-10-16 DIAGNOSIS — L400 Psoriasis vulgaris: Secondary | ICD-10-CM | POA: Diagnosis not present

## 2015-10-16 MED FILL — OTEZLA 30 MG TABS: 30 | 30 days supply | Qty: 60 | Fill #1

## 2015-11-20 ENCOUNTER — Encounter: Payer: Self-pay | Admitting: Family Medicine

## 2015-11-20 MED FILL — OTEZLA 30 MG TABS: 30 | 90 days supply | Qty: 180 | Fill #0

## 2015-11-20 MED FILL — CLOBETASOL 0.05% CREAM: 0.05 | 90 days supply | Qty: 180 | Fill #0

## 2015-11-20 MED FILL — CALCIPOTRIENE 0.005% SOLN: 0.005 | 90 days supply | Qty: 180 | Fill #0

## 2015-11-28 ENCOUNTER — Ambulatory Visit: Payer: Self-pay | Admitting: Family Medicine

## 2016-01-05 ENCOUNTER — Telehealth: Payer: Self-pay | Admitting: Internal Medicine

## 2016-01-05 NOTE — Telephone Encounter (Signed)
Magda Paganini, can you help me with this?

## 2016-01-05 NOTE — Telephone Encounter (Signed)
Patient called to say that his brother had a tubular adenoma when he had his colonoscopy 5 yrs ago and brother's colonoscopy last month came back normal. Pt is wanting to know if he should have his colonoscopy now or does he need to wait. He is not on the recall list, but his last colonoscopy was in 2011. Patient said if he does need to have a colonoscopy he would prefer having it in late June or July. He said he would like to have it done on June 29 if he does need to have one done. Please advise and call him on cell [phone]

## 2016-01-05 NOTE — Telephone Encounter (Addendum)
Brother with advanced colon polyp around age 55. Patient needs to have high risk screening colonoscopy every five years so he would be due at any time.  Please triage patient for his requested date if available. Dr. Gala Romney may be on CME that week?

## 2016-01-07 ENCOUNTER — Telehealth: Payer: Self-pay

## 2016-01-07 NOTE — Telephone Encounter (Signed)
See separate triage.  

## 2016-01-07 NOTE — Telephone Encounter (Signed)
OK to schedule

## 2016-01-07 NOTE — Telephone Encounter (Addendum)
Gastroenterology Pre-Procedure Review  Request Date: 01/07/2016 Requesting Physician:   PATIENT REVIEW QUESTIONS: The patient responded to the following health history questions as indicated:    Pt's last colonoscopy was 04/22/2010 by Dr. Gala Romney His next was due in 03/2015 Brother had an advanced colon polyp  1. Diabetes Melitis: no 2. Joint replacements in the past 12 months: no 3. Major health problems in the past 3 months: no 4. Has an artificial valve or MVP: no 5. Has a defibrillator: no 6. Has been advised in past to take antibiotics in advance of a procedure like teeth cleaning: no 7. Family history of colon cancer: no  8. Alcohol Use: no 9. History of sleep apnea: no     MEDICATIONS & ALLERGIES:    Patient reports the following regarding taking any blood thinners:   Plavix? no Aspirin? no Coumadin? no  Patient confirms/reports the following medications:  Current Outpatient Prescriptions  Medication Sig Dispense Refill  . Apremilast 30 MG TABS Take by mouth 2 (two) times daily.     No current facility-administered medications for this visit.    Patient confirms/reports the following allergies:  Allergies  Allergen Reactions  . Other     Hazelnut     No orders of the defined types were placed in this encounter.    AUTHORIZATION INFORMATION Primary Insurance:  ID #:   Group #:  Pre-Cert / Auth required:  Pre-Cert / Auth #:   Secondary Insurance:   ID #:   Group #:  Pre-Cert / Auth required: Pre-Cert / Auth #:   SCHEDULE INFORMATION: Procedure has been scheduled as follows:  Date: 02/27/2016             Time:  7:30 Am Location: [facility] Short Stay  This Gastroenterology Pre-Precedure Review Form is being routed to the following provider(s): R. Garfield Cornea, MD

## 2016-01-08 DIAGNOSIS — L821 Other seborrheic keratosis: Secondary | ICD-10-CM | POA: Diagnosis not present

## 2016-01-08 DIAGNOSIS — D225 Melanocytic nevi of trunk: Secondary | ICD-10-CM | POA: Diagnosis not present

## 2016-01-20 ENCOUNTER — Other Ambulatory Visit: Payer: Self-pay

## 2016-01-20 ENCOUNTER — Encounter: Payer: Self-pay | Admitting: Family Medicine

## 2016-01-20 DIAGNOSIS — Z1211 Encounter for screening for malignant neoplasm of colon: Secondary | ICD-10-CM

## 2016-01-20 MED ORDER — NA SULFATE-K SULFATE-MG SULF 17.5-3.13-1.6 GM/177ML PO SOLN: 1.0000 | ORAL | Status: DC

## 2016-01-20 NOTE — Addendum Note (Signed)
Addended by: Everardo All on: 01/20/2016 01:47 PM   Modules accepted: Orders

## 2016-01-20 NOTE — Telephone Encounter (Signed)
July schedule is out now. Pt is on 02/27/2016 at 7:30 Am with Dr. Gala Romney Sending the Rx to the pharmacy and mailing the instructions to pt.

## 2016-02-17 NOTE — Telephone Encounter (Signed)
i have done a precer online for the [facility] and they will e-mail [place] with the PA number

## 2016-02-19 ENCOUNTER — Ambulatory Visit (HOSPITAL_BASED_OUTPATIENT_CLINIC_OR_DEPARTMENT_OTHER): Payer: 59 | Admitting: Pharmacist

## 2016-02-19 DIAGNOSIS — L409 Psoriasis, unspecified: Secondary | ICD-10-CM

## 2016-02-19 MED ORDER — APREMILAST 30 MG PO TABS: 1.0000 | ORAL_TABLET | Freq: Two times a day (BID) | ORAL | Status: DC

## 2016-02-19 MED FILL — *OTEZLA 30 MG TABLET: 30 | 90 days supply | Qty: 180 | Fill #0

## 2016-02-19 NOTE — Telephone Encounter (Signed)
NO PA NEEDED PER ASH P. AT INSURANCE COMPANY

## 2016-02-19 NOTE — Progress Notes (Signed)
S: Patient presents today to the [facility].  Patient is currently taking Otezla for psoriasis. Patient is managed by Dr. Rozann Lesches for this.   Adherence: reports that he will sometimes forget the morning dose of the [place]. He is looking into getting a pill box to help him to remember to take the dose.  He reports that the [place] Nail has been working well for the psoriasis in terms to reducing surface area affected on his legs. He still has areas on his back and scalp but they are more manageable. 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  Monitoring: GI upset: nausea and diarrhea, which have improved over time. He takes the [place] with food to prevent nausea. Neuropsychiatric changes: denies Labs: WNL and monitored regularly   O:     Lab Results  Component Value Date   WBC 3.3* 01/30/2013   HGB 14.7 01/30/2013   HCT 40.9 01/30/2013   MCV 88.3 01/30/2013   PLT 236 01/30/2013      Chemistry      Component Value Date/Time   NA 136 05/13/2008 1215   K 4.2 05/13/2008 1215   CL 99 05/13/2008 1215   CO2 31 05/13/2008 1215   BUN 9 05/13/2008 1215   CREATININE 0.93 05/13/2008 1215      Component Value Date/Time   CALCIUM 9.2 05/13/2008 1215   ALKPHOS 62 05/13/2008 1215   AST 22 05/13/2008 1215   ALT 19 05/13/2008 1215   BILITOT 0.9 05/13/2008 1215       A/P: 1. Medication review: patient on Otezla and tolerating it well with the only adverse effect being GI upset. It has improved the psoriasis and he is happy with the results. Reviewed the medication, including the GI adverse effects, need for regular monitoring, and also reviewed the benefit of a pill box for adherence. No recommendations for changes.    Nicoletta Ba, PharmD, BCPS, [place] [facility] [phone]  Evaluation and management procedures were performed by the Clinical Pharmacy Practitioner under my supervision and collaboration. I have reviewed the Practitioner's note and chart, and I agree with the management and plan as documented above.   Angelica Chessman, MD, [place], [place], [place], [place] and [place], [place]   02/25/2016, 9:56 AM

## 2016-02-20 MED FILL — CALCIPOTRIENE 0.005% CREAM: 0.005 | 30 days supply | Qty: 120 | Fill #0

## 2016-02-23 ENCOUNTER — Telehealth: Payer: Self-pay

## 2016-02-23 MED FILL — SUPREP BOWEL PREP KIT: 17.5-3.13-1 | 1 days supply | Qty: 354 | Fill #0

## 2016-02-23 NOTE — Telephone Encounter (Signed)
I called Dr. Nicki Reaper to update med list prior to colonoscopy on 02/27/2016. He said there has been no change in his med list.

## 2016-02-25 NOTE — Telephone Encounter (Signed)
OK to schedule

## 2016-02-27 ENCOUNTER — Ambulatory Visit (HOSPITAL_COMMUNITY)
Admission: RE | Admit: 2016-02-27 | Discharge: 2016-02-27 | Disposition: A | Discharge status: Home / Self Care | Payer: 59 | Source: Ambulatory Visit | Attending: Internal Medicine | Admitting: Internal Medicine

## 2016-02-27 ENCOUNTER — Encounter (HOSPITAL_COMMUNITY)
Admission: RE | Disposition: A | Discharge status: Home / Self Care | Payer: Self-pay | Source: Ambulatory Visit | Attending: Internal Medicine

## 2016-02-27 ENCOUNTER — Encounter (HOSPITAL_COMMUNITY): Payer: Self-pay | Admitting: *Deleted

## 2016-02-27 DIAGNOSIS — Z9889 Other specified postprocedural states: Secondary | ICD-10-CM | POA: Insufficient documentation

## 2016-02-27 DIAGNOSIS — Z79899 Other long term (current) drug therapy: Secondary | ICD-10-CM | POA: Insufficient documentation

## 2016-02-27 DIAGNOSIS — Z8371 Family history of colonic polyps: Secondary | ICD-10-CM | POA: Insufficient documentation

## 2016-02-27 DIAGNOSIS — Z1211 Encounter for screening for malignant neoplasm of colon: Secondary | ICD-10-CM | POA: Insufficient documentation

## 2016-02-27 DIAGNOSIS — L409 Psoriasis, unspecified: Secondary | ICD-10-CM | POA: Insufficient documentation

## 2016-02-27 HISTORY — PX: COLONOSCOPY: SHX5424

## 2016-02-27 HISTORY — DX: Psoriasis, unspecified: L40.9

## 2016-02-27 SURGERY — COLONOSCOPY
Anesthesia: Moderate Sedation

## 2016-02-27 MED ORDER — MEPERIDINE HCL 100 MG/ML IJ SOLN
INTRAMUSCULAR | Status: DC | PRN
  Administered 2016-02-27 (×2): 50 mg via INTRAVENOUS

## 2016-02-27 MED ORDER — SODIUM CHLORIDE 0.9 % IV SOLN: INTRAVENOUS | Status: DC

## 2016-02-27 MED ORDER — MIDAZOLAM HCL 5 MG/5ML IJ SOLN
INTRAMUSCULAR | Status: AC
  Filled 2016-02-27: qty 10

## 2016-02-27 MED ORDER — MEPERIDINE HCL 100 MG/ML IJ SOLN
INTRAMUSCULAR | Status: AC
  Filled 2016-02-27: qty 2

## 2016-02-27 MED ORDER — ONDANSETRON HCL 4 MG/2ML IJ SOLN
INTRAMUSCULAR | Status: AC
  Filled 2016-02-27: qty 2

## 2016-02-27 MED ORDER — MIDAZOLAM HCL 5 MG/5ML IJ SOLN
INTRAMUSCULAR | Status: DC | PRN
  Administered 2016-02-27 (×2): 2 mg via INTRAVENOUS
  Administered 2016-02-27: 1 mg via INTRAVENOUS

## 2016-02-27 MED ORDER — ONDANSETRON HCL 4 MG/2ML IJ SOLN
INTRAMUSCULAR | Status: DC | PRN
  Administered 2016-02-27: 4 mg via INTRAVENOUS

## 2016-02-27 NOTE — Op Note (Signed)
[facility] Patient Name: Gary Fischer Procedure Date: 02/27/2016 7:22 AM MRN: [id number] Date of Birth: [birth year] Attending MD: Norvel Richards , MD CSN: [id number] Age: 55 Admit Type: Outpatient Procedure:                [place]o-colonoscopy - High risk screeningg Indications:              Colon cancer screening in patient with 1st-degree                            relative having advanced adenoma of the colon                            before age 44 Providers:                Norvel Richards, MD, Lurline Del, RN, Isabella Stalling, Technician Referring MD:              Medicines:                Midazolam 5 mg IV, Meperidine 100 mg IV,                            Ondansetron 4 mg IV Complications:            No immediate complications. Estimated Blood Loss:     Estimated blood loss: none. Procedure:                Pre-Anesthesia Assessment:                           - Prior to the procedure, a History and Physical                            was performed, and patient medications and                            allergies were reviewed. The patient's tolerance of                            previous anesthesia was also reviewed. The risks                            and benefits of the procedure and the sedation                            options and risks were discussed with the patient.                            All questions were answered, and informed consent                            was obtained. Prior Anticoagulants: The patient has  taken no previous anticoagulant or antiplatelet                            agents. [ASA Grade]. After reviewing the risks and                            benefits, the patient was deemed in satisfactory                            condition to undergo the procedure.                           After obtaining informed consent, the colonoscope                            was passed under direct  vision. Throughout the                            procedure, the patient's blood pressure, pulse, and                            oxygen saturations were monitored continuously. The                            [id number]) was introduced through the anus                            and advanced to the 5 cm into the ileum. The                            colonoscopy was performed without difficulty. The                            patient tolerated the procedure well. The quality                            of the bowel preparation was adequate. The terminal                            ileum, ileocecal valve, appendiceal orifice, and                            rectum were photographed. Scope In: 7:50:23 AM Scope Out: 8:05:12 AM Scope Withdrawal Time: 0 hours 9 minutes 58 seconds  Total Procedure Duration: 0 hours 14 minutes 49 seconds  Findings:      The perianal and digital rectal examinations were normal.      The colon appeared normal.      The exam was otherwise without abnormality on direct and retroflexion       views. the distal 5 cm of terminal ileal mucosa also appeared normal. Impression:               - The entire colon is normal.                           -  No specimens collected. Moderate Sedation:      Moderate (conscious) sedation was administered by the endoscopy nurse       and supervised by the endoscopist. The following parameters were       monitored: oxygen saturation, heart rate, blood pressure, respiratory       rate, EKG, adequacy of pulmonary ventilation, and response to care.       Total physician intraservice time was 24 minutes. Recommendation:           - Patient has a contact number available for                            emergencies. The signs and symptoms of potential                            delayed complications were discussed with the                            patient. Return to normal activities tomorrow.                            Written  discharge instructions were provided to the                            patient.                           - Advance diet as tolerated.                           - Continue present medications.                           - Repeat colonoscopy in 5 years for screening                            purposes.                           - Return to GI office PRN. Procedure Code(s):        --- Professional ---                           [phone], Colonoscopy, flexible; diagnostic, including                            collection of specimen(s) by brushing or washing,                            when performed (separate procedure)                           99152, Moderate sedation services provided by the                            same physician or other qualified health care  professional performing the diagnostic or                            therapeutic service that the sedation supports,                            requiring the presence of an independent trained                            observer to assist in the monitoring of the                            patient's level of consciousness and physiological                            status; initial 15 minutes of intraservice time,                            patient age 58 years or older                           [phone], Moderate sedation services; each additional                            15 minutes intraservice time Diagnosis Code(s):        --- Professional ---                           Z83.71, Family history of colonic polyps CPT copyright 2016 American Medical Association. All rights reserved. The codes documented in this report are preliminary and upon coder review may  be revised to meet current compliance requirements. Cristopher Estimable. Josh Nicolosi, MD Norvel Richards, MD 02/27/2016 8:17:32 AM This report has been signed electronically. Number of Addenda: 0

## 2016-02-27 NOTE — H&P (Signed)
@  SLHT@   Primary Care Physician:  Mickie Hillier, MD Primary Gastroenterologist:  Dr. Gala Romney  Pre-Procedure History & Physical: HPI:  Gary Fischer is a 55 y.o. male is here for a screening colonoscopy. No bowel symptoms. Family history of tubulovillous adenoma less than 44 years of age of first degree relative. Negative colonoscopy 2011  Past Medical History  Diagnosis Date  . Psoriasis     Past Surgical History  Procedure Laterality Date  . Nose surgery      devated septum    Prior to Admission medications   Medication Sig Start Date End Date Taking? Authorizing Provider  Apremilast (OTEZLA) 30 MG TABS Take 1 tablet by mouth 2 (two) times daily. 02/19/16  Yes Olugbemiga Essie Christine, MD  Na Sulfate-K Sulfate-Mg Sulf (SUPREP BOWEL PREP KIT) 17.5-3.13-1.6 GM/180ML SOLN Take 1 kit by mouth as directed. 01/20/16  Yes Daneil Dolin, MD    Allergies as of 01/20/2016 - Review Complete 01/07/2016  Allergen Reaction Noted  . Other  05/03/2011    History reviewed. No pertinent family history.  Social History   Social History  . Marital Status: Married    Spouse Name: N/A  . Number of Children: N/A  . Years of Education: N/A   Occupational History  . Not on file.   Social History Main Topics  . Smoking status: Never Smoker   . Smokeless tobacco: Never Used  . Alcohol Use: No  . Drug Use: No  . Sexual Activity: Not on file   Other Topics Concern  . Not on file   Social History Narrative    Review of Systems: See HPI, otherwise negative ROS  Physical Exam: BP 114/78 mmHg  Pulse 48  Temp(Src) 97.7 F (36.5 C) (Oral)  Resp 16  Ht '6\' 1"'$  (1.854 m)  Wt 167 lb (75.751 kg)  BMI 22.04 kg/m2  SpO2 98% General:   Alert,  Well-developed, well-nourished, pleasant and cooperative in NAD Head:  Normocephalic and atraumatic. Mouth:  No deformity or lesions, dentition normal. Neck:  Supple; no masses or thyromegaly. Lungs:  Clear throughout to auscultation.   No wheezes,  crackles, or rhonchi. No acute distress. Heart:  Regular rate and rhythm; no murmurs, clicks, rubs,  or gallops. Abdomen:  Soft, nontender and nondistended. No masses, hepatosplenomegaly or hernias noted. Normal bowel sounds, without guarding, and without rebound.     Impression:  Gary Fischer is now here to undergo a screening colonoscopy. High risk screening examination.  Risks, benefits, limitations, imponderables and alternatives regarding colonoscopy have been reviewed with the patient. Questions have been answered. All parties agreeable.     Notice:  This dictation was prepared with Dragon dictation along with smaller phrase technology. Any transcriptional errors that result from this process are unintentional and may not be corrected upon review.

## 2016-02-27 NOTE — Discharge Instructions (Signed)

## 2016-03-04 ENCOUNTER — Encounter (HOSPITAL_COMMUNITY): Payer: Self-pay | Admitting: Internal Medicine

## 2016-05-26 DIAGNOSIS — L818 Other specified disorders of pigmentation: Secondary | ICD-10-CM | POA: Diagnosis not present

## 2016-05-26 DIAGNOSIS — L814 Other melanin hyperpigmentation: Secondary | ICD-10-CM | POA: Diagnosis not present

## 2016-05-26 DIAGNOSIS — Z1283 Encounter for screening for malignant neoplasm of skin: Secondary | ICD-10-CM | POA: Diagnosis not present

## 2016-05-26 DIAGNOSIS — D485 Neoplasm of uncertain behavior of skin: Secondary | ICD-10-CM | POA: Diagnosis not present

## 2016-05-26 DIAGNOSIS — D225 Melanocytic nevi of trunk: Secondary | ICD-10-CM | POA: Diagnosis not present

## 2016-06-11 MED FILL — OTEZLA 30 MG TABS: 30 | 30 days supply | Qty: 60 | Fill #1

## 2016-07-14 MED FILL — OTEZLA 30 MG TABS: 30 | 30 days supply | Qty: 60 | Fill #2

## 2016-08-20 MED FILL — OTEZLA 30 MG TABS: 30 | 30 days supply | Qty: 60 | Fill #3

## 2016-09-24 MED FILL — OTEZLA 30 MG TABS: 30 | 30 days supply | Qty: 60 | Fill #4

## 2016-10-07 DIAGNOSIS — Z1283 Encounter for screening for malignant neoplasm of skin: Secondary | ICD-10-CM | POA: Diagnosis not present

## 2016-10-07 DIAGNOSIS — D485 Neoplasm of uncertain behavior of skin: Secondary | ICD-10-CM | POA: Diagnosis not present

## 2016-10-07 DIAGNOSIS — L400 Psoriasis vulgaris: Secondary | ICD-10-CM | POA: Diagnosis not present

## 2016-11-05 MED FILL — OTEZLA 30 MG TABS: 30 | 30 days supply | Qty: 60 | Fill #5

## 2016-11-11 ENCOUNTER — Encounter: Payer: Self-pay | Admitting: Family Medicine

## 2016-12-22 MED FILL — OTEZLA 30 MG TABS: 30 | 30 days supply | Qty: 60 | Fill #6

## 2017-01-11 MED FILL — CLOBETASOL 0.05% CREAM: 0.05 | 90 days supply | Qty: 180 | Fill #0

## 2017-01-20 MED FILL — CALCIPOTRIENE 0.005% SOLN: 0.005 | 14 days supply | Qty: 60 | Fill #0

## 2017-01-31 MED FILL — OTEZLA 30 MG TABS: 30 | 30 days supply | Qty: 60 | Fill #0

## 2017-03-04 MED FILL — OTEZLA 30 MG TABS: 30 | 30 days supply | Qty: 60 | Fill #1

## 2017-03-22 DIAGNOSIS — Z1283 Encounter for screening for malignant neoplasm of skin: Secondary | ICD-10-CM | POA: Diagnosis not present

## 2017-03-22 DIAGNOSIS — L400 Psoriasis vulgaris: Secondary | ICD-10-CM | POA: Diagnosis not present

## 2017-04-04 MED FILL — OTEZLA 30 MG TABS: 30 | 30 days supply | Qty: 60 | Fill #2

## 2017-05-06 MED FILL — OTEZLA 30 MG TABS: 30 | 30 days supply | Qty: 60 | Fill #3

## 2017-06-08 MED FILL — OTEZLA 30 MG TABS: 30 | 30 days supply | Qty: 60 | Fill #4

## 2017-07-08 MED FILL — OTEZLA 30 MG TABS: 30 | 30 days supply | Qty: 60 | Fill #5

## 2017-08-11 ENCOUNTER — Other Ambulatory Visit: Payer: Self-pay | Admitting: Pharmacist

## 2017-08-11 MED ORDER — APREMILAST 30 MG PO TABS: 1.0000 | ORAL_TABLET | Freq: Two times a day (BID) | ORAL | 0 refills | Status: DC

## 2017-08-11 MED FILL — OTEZLA 30 MG TABS: 30 | 30 days supply | Qty: 60 | Fill #0

## 2017-08-12 ENCOUNTER — Telehealth: Payer: Self-pay | Admitting: Pharmacist

## 2017-08-12 NOTE — Telephone Encounter (Signed)
Called patient to schedule an appointment for the [facility]. I was unable to reach the patient so I left a HIPAA-compliant message requesting that the patient return my call.   

## 2017-08-18 NOTE — Telephone Encounter (Signed)
Received email from patient. Overall, he is doing well and no changes since I last saw him. Will plan appt for January.

## 2017-08-31 ENCOUNTER — Ambulatory Visit (HOSPITAL_BASED_OUTPATIENT_CLINIC_OR_DEPARTMENT_OTHER): Payer: 59 | Admitting: Pharmacist

## 2017-08-31 ENCOUNTER — Encounter: Payer: Self-pay | Admitting: Pharmacist

## 2017-08-31 DIAGNOSIS — Z79899 Other long term (current) drug therapy: Secondary | ICD-10-CM

## 2017-08-31 NOTE — Progress Notes (Signed)
S: Patient presents today to the [facility] for review of his medication.  Patient is currently taking Otezla for psoriasis. Patient is managed by Dr. Rozann Lesches for this.   Adherence: denies any recent missed doses.  He reports that the [place] Nail has been working well for the psoriasis in terms to reducing surface area affected on his legs. He still has areas on his back and scalp but they are more manageable. He thinks it is better than just using clobetasol cream.  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 He is also worried about the increased risk of melanoma.  Monitoring: GI upset: resolved except when he does eat much during the day, then might have some nausea Neuropsychiatric changes: denies Labs: WNL and monitored regularly   O:     Lab Results  Component Value Date   WBC 3.3 (L) 01/30/2013   HGB 14.7 01/30/2013   HCT 40.9 01/30/2013   MCV 88.3 01/30/2013   PLT 236 01/30/2013      Chemistry      Component Value Date/Time   NA 136 05/13/2008 1215   K 4.2 05/13/2008 1215   CL 99 05/13/2008 1215   CO2 31 05/13/2008 1215   BUN 9 05/13/2008 1215   CREATININE 0.93 05/13/2008 1215      Component Value Date/Time   CALCIUM 9.2 05/13/2008 1215   ALKPHOS 62 05/13/2008 1215   AST 22 05/13/2008 1215   ALT 19 05/13/2008 1215   BILITOT 0.9 05/13/2008 1215       A/P: 1. Medication review: patient on Otezla and tolerating it well with improvement of psoriasis but not full resolution. GI side effects have mostly resolved. No recommendations for any changes.  Christella Hartigan, PharmD, BCPS, BCACP, [place] and [facility] [phone]

## 2017-09-15 MED FILL — OTEZLA 30 MG TABS: 30 | 30 days supply | Qty: 60 | Fill #1

## 2017-10-19 MED FILL — OTEZLA 30 MG TABS: 30 | 30 days supply | Qty: 60 | Fill #2

## 2017-11-18 DIAGNOSIS — L409 Psoriasis, unspecified: Secondary | ICD-10-CM | POA: Diagnosis not present

## 2017-11-18 DIAGNOSIS — I491 Atrial premature depolarization: Secondary | ICD-10-CM | POA: Diagnosis not present

## 2017-11-18 DIAGNOSIS — I4510 Unspecified right bundle-branch block: Secondary | ICD-10-CM | POA: Diagnosis not present

## 2017-11-18 DIAGNOSIS — Z6822 Body mass index (BMI) 22.0-22.9, adult: Secondary | ICD-10-CM | POA: Diagnosis not present

## 2017-11-18 DIAGNOSIS — Z0001 Encounter for general adult medical examination with abnormal findings: Secondary | ICD-10-CM | POA: Diagnosis not present

## 2017-11-23 DIAGNOSIS — L400 Psoriasis vulgaris: Secondary | ICD-10-CM | POA: Diagnosis not present

## 2017-11-23 DIAGNOSIS — Z1283 Encounter for screening for malignant neoplasm of skin: Secondary | ICD-10-CM | POA: Diagnosis not present

## 2017-11-23 DIAGNOSIS — D225 Melanocytic nevi of trunk: Secondary | ICD-10-CM | POA: Diagnosis not present

## 2017-11-23 DIAGNOSIS — D485 Neoplasm of uncertain behavior of skin: Secondary | ICD-10-CM | POA: Diagnosis not present

## 2017-11-24 ENCOUNTER — Other Ambulatory Visit: Payer: Self-pay | Admitting: Pharmacist

## 2017-11-24 MED ORDER — APREMILAST 30 MG PO TABS: 1.0000 | ORAL_TABLET | Freq: Two times a day (BID) | ORAL | 4 refills | Status: DC

## 2017-11-24 MED FILL — CLOBETASOL PROPIONATE 0.05: 0.05 | 90 days supply | Qty: 180 | Fill #0

## 2017-11-24 MED FILL — CALCIPOTRIENE 0.005% SOLN: 0.005 | 90 days supply | Qty: 180 | Fill #0

## 2017-11-28 ENCOUNTER — Other Ambulatory Visit: Payer: Self-pay | Admitting: Pharmacist

## 2017-11-28 MED ORDER — APREMILAST 30 MG PO TABS: 1.0000 | ORAL_TABLET | Freq: Two times a day (BID) | ORAL | 0 refills | Status: DC

## 2017-11-28 MED ORDER — APREMILAST 30 MG PO TABS: 1.0000 | ORAL_TABLET | Freq: Two times a day (BID) | ORAL | 3 refills | Status: DC

## 2017-11-28 MED FILL — OTEZLA 30 MG TABS: 30 | 30 days supply | Qty: 60 | Fill #0

## 2018-01-02 MED FILL — OTEZLA 30 MG TABS: 30 | 30 days supply | Qty: 60 | Fill #1

## 2018-02-03 MED FILL — OTEZLA 30 MG TABS: 30 | 30 days supply | Qty: 60 | Fill #2

## 2018-02-22 DIAGNOSIS — D225 Melanocytic nevi of trunk: Secondary | ICD-10-CM | POA: Diagnosis not present

## 2018-03-07 MED FILL — OTEZLA 30 MG TABS: 30 | 30 days supply | Qty: 60 | Fill #3

## 2018-04-10 MED FILL — OTEZLA 30 MG TABS: 30 | 30 days supply | Qty: 60 | Fill #4

## 2018-05-09 MED FILL — OTEZLA 30 MG TABS: 30 | 30 days supply | Qty: 60 | Fill #5

## 2018-05-18 DIAGNOSIS — H524 Presbyopia: Secondary | ICD-10-CM | POA: Diagnosis not present

## 2018-05-18 DIAGNOSIS — H5213 Myopia, bilateral: Secondary | ICD-10-CM | POA: Diagnosis not present

## 2018-05-18 DIAGNOSIS — H52223 Regular astigmatism, bilateral: Secondary | ICD-10-CM | POA: Diagnosis not present

## 2018-05-18 DIAGNOSIS — H2513 Age-related nuclear cataract, bilateral: Secondary | ICD-10-CM | POA: Diagnosis not present

## 2018-06-08 MED FILL — OTEZLA 30 MG TABS: 30 | 30 days supply | Qty: 60 | Fill #6

## 2018-06-27 ENCOUNTER — Other Ambulatory Visit: Payer: Self-pay

## 2018-06-27 MED ORDER — AMOXICILLIN 500 MG PO TABS: ORAL_TABLET | ORAL | 0 refills | Status: DC

## 2018-07-14 MED FILL — OTEZLA 30 MG TABS: 30 | 30 days supply | Qty: 60 | Fill #7

## 2018-08-10 MED FILL — OTEZLA 30 MG TABS: 30 | 30 days supply | Qty: 60 | Fill #8

## 2018-09-15 MED FILL — OTEZLA 30 MG TABS: 30 | 30 days supply | Qty: 60 | Fill #9

## 2018-10-19 MED FILL — OTEZLA 30 MG TABS: 30 | 30 days supply | Qty: 60 | Fill #10

## 2018-11-08 MED FILL — OTEZLA 30 MG TABS: 30 | 30 days supply | Qty: 60 | Fill #11

## 2018-11-21 ENCOUNTER — Ambulatory Visit (INDEPENDENT_AMBULATORY_CARE_PROVIDER_SITE_OTHER): Payer: 59 | Admitting: Pharmacist

## 2018-11-21 ENCOUNTER — Other Ambulatory Visit: Payer: Self-pay

## 2018-11-21 DIAGNOSIS — Z79899 Other long term (current) drug therapy: Secondary | ICD-10-CM

## 2018-11-21 NOTE — Progress Notes (Signed)
S: Patient presents for review of his medication.  Patient is currently taking Otezla for psoriasis. Patient is managed by Dr. Rozann Lesches for this.   Adherence: denies any recent missed doses.  Efficacy: would prefer to not take any meds but needs the Otezla to lessen the thickness of the plaques as well as limit the itching  Monitoring: GI upset: denies Neuropsychiatric changes: denies Labs: WNL and monitored regularly    O:     Lab Results  Component Value Date   WBC 3.3 (L) 01/30/2013   HGB 14.7 01/30/2013   HCT 40.9 01/30/2013   MCV 88.3 01/30/2013   PLT 236 01/30/2013      Chemistry      Component Value Date/Time   NA 136 05/13/2008 1215   K 4.2 05/13/2008 1215   CL 99 05/13/2008 1215   CO2 31 05/13/2008 1215   BUN 9 05/13/2008 1215   CREATININE 0.93 05/13/2008 1215      Component Value Date/Time   CALCIUM 9.2 05/13/2008 1215   ALKPHOS 62 05/13/2008 1215   AST 22 05/13/2008 1215   ALT 19 05/13/2008 1215   BILITOT 0.9 05/13/2008 1215       A/P: 1. Medication review: patient on Otezla and tolerating it well with continued control of psoriasis. Denies any side effects. No recommendations for any changes.  Christella Hartigan, PharmD, BCPS, BCACP, CPP Clinical Pharmacist Practitioner  [phone]

## 2018-11-24 ENCOUNTER — Other Ambulatory Visit: Payer: Self-pay | Admitting: Pharmacist

## 2018-11-24 MED ORDER — APREMILAST 30 MG PO TABS: 1.0000 | ORAL_TABLET | Freq: Two times a day (BID) | ORAL | 2 refills | Status: DC

## 2018-11-24 MED FILL — OTEZLA 30 MG TABS: 30 | 30 days supply | Qty: 60 | Fill #0 | Status: TO

## 2019-01-29 MED FILL — OTEZLA 30 MG TABS: 30 | 30 days supply | Qty: 60 | Fill #0

## 2019-02-14 ENCOUNTER — Encounter: Payer: Self-pay | Admitting: Family Medicine

## 2019-02-14 DIAGNOSIS — L409 Psoriasis, unspecified: Secondary | ICD-10-CM | POA: Diagnosis not present

## 2019-02-14 DIAGNOSIS — Z125 Encounter for screening for malignant neoplasm of prostate: Secondary | ICD-10-CM | POA: Diagnosis not present

## 2019-02-14 DIAGNOSIS — Z79899 Other long term (current) drug therapy: Secondary | ICD-10-CM | POA: Diagnosis not present

## 2019-02-14 DIAGNOSIS — I4510 Unspecified right bundle-branch block: Secondary | ICD-10-CM | POA: Diagnosis not present

## 2019-02-15 DIAGNOSIS — L409 Psoriasis, unspecified: Secondary | ICD-10-CM | POA: Diagnosis not present

## 2019-02-15 DIAGNOSIS — E785 Hyperlipidemia, unspecified: Secondary | ICD-10-CM | POA: Diagnosis not present

## 2019-03-07 MED FILL — OTEZLA 30 MG TABS: 30 | 30 days supply | Qty: 60 | Fill #1

## 2019-04-11 MED FILL — OTEZLA 30 MG TABS: 30 | 30 days supply | Qty: 60 | Fill #2

## 2019-05-18 MED FILL — OTEZLA 30 MG TABS: 30 | 30 days supply | Qty: 60 | Fill #3

## 2019-05-31 DIAGNOSIS — D225 Melanocytic nevi of trunk: Secondary | ICD-10-CM | POA: Diagnosis not present

## 2019-05-31 DIAGNOSIS — Z1283 Encounter for screening for malignant neoplasm of skin: Secondary | ICD-10-CM | POA: Diagnosis not present

## 2019-05-31 DIAGNOSIS — L400 Psoriasis vulgaris: Secondary | ICD-10-CM | POA: Diagnosis not present

## 2019-06-01 ENCOUNTER — Other Ambulatory Visit: Payer: Self-pay | Admitting: Pharmacist

## 2019-06-01 MED ORDER — OTEZLA 30 MG PO TABS: 1.0000 | ORAL_TABLET | Freq: Two times a day (BID) | ORAL | 2 refills | Status: DC

## 2019-06-11 MED FILL — OTEZLA 30 MG TABS: 30 | 30 days supply | Qty: 60 | Fill #0

## 2019-07-12 MED FILL — OTEZLA 30 MG TABS: 30 | 30 days supply | Qty: 60 | Fill #1

## 2019-08-30 MED FILL — OTEZLA 30 MG TABS: 30 | 30 days supply | Qty: 60 | Fill #0

## 2019-09-25 MED FILL — OTEZLA 30 MG TABS: 30 | 30 days supply | Qty: 60 | Fill #1

## 2019-09-26 ENCOUNTER — Encounter: Payer: Self-pay | Admitting: Family Medicine

## 2019-10-22 MED FILL — OTEZLA 30 MG TABS: 30 | 30 days supply | Qty: 60 | Fill #2

## 2019-11-15 ENCOUNTER — Telehealth: Payer: Self-pay | Admitting: Pharmacist

## 2019-11-15 NOTE — Telephone Encounter (Signed)
Called patient to schedule an appointment for the [facility]. I was unable to reach the patient so I left a HIPAA-compliant message requesting that the patient return my call.   

## 2019-11-19 MED FILL — OTEZLA 30 MG TABS: 30 | 30 days supply | Qty: 60 | Fill #3

## 2019-11-29 ENCOUNTER — Ambulatory Visit (HOSPITAL_BASED_OUTPATIENT_CLINIC_OR_DEPARTMENT_OTHER): Payer: 59 | Admitting: Pharmacist

## 2019-11-29 ENCOUNTER — Other Ambulatory Visit: Payer: Self-pay

## 2019-11-29 DIAGNOSIS — Z79899 Other long term (current) drug therapy: Secondary | ICD-10-CM

## 2019-11-29 NOTE — Progress Notes (Signed)
S: Patient presents for review of his medication.  Patient is currently taking Otezla for psoriasis. Patient is managed by Dr. Rozann Lesches for this.   Adherence: denies any recent missed doses. Reports occasional missed evening dose but is doing better with this.   Efficacy: reports decreased plaque thickness and itching with Otezla  Monitoring: GI upset: denies Neuropsychiatric changes: denies Labs: WNL and monitored regularly    O:  Lab Results  Component Value Date   WBC 3.3 (L) 01/30/2013   HGB 14.7 01/30/2013   HCT 40.9 01/30/2013   MCV 88.3 01/30/2013   PLT 236 01/30/2013      Chemistry      Component Value Date/Time   NA 136 05/13/2008 1215   K 4.2 05/13/2008 1215   CL 99 05/13/2008 1215   CO2 31 05/13/2008 1215   BUN 9 05/13/2008 1215   CREATININE 0.93 05/13/2008 1215      Component Value Date/Time   CALCIUM 9.2 05/13/2008 1215   ALKPHOS 62 05/13/2008 1215   AST 22 05/13/2008 1215   ALT 19 05/13/2008 1215   BILITOT 0.9 05/13/2008 1215       A/P: 1. Medication review: patient on Otezla and tolerating it well with continued control of psoriasis. Denies any side effects. No recommendations for any changes.  Benard Halsted, PharmD, [place] [phone]

## 2019-12-26 MED FILL — OTEZLA 30 MG TABS: 30 | 30 days supply | Qty: 60 | Fill #4

## 2020-02-29 MED FILL — OTEZLA 30 MG TABS: 30 | 30 days supply | Qty: 60 | Fill #6

## 2020-03-21 ENCOUNTER — Other Ambulatory Visit (HOSPITAL_COMMUNITY): Payer: Self-pay | Admitting: Internal Medicine

## 2020-03-21 ENCOUNTER — Other Ambulatory Visit: Payer: Self-pay | Admitting: Pharmacist

## 2020-03-21 MED ORDER — OTEZLA 30 MG PO TABS: 1.0000 | ORAL_TABLET | Freq: Two times a day (BID) | ORAL | 3 refills | Status: DC

## 2020-03-31 MED FILL — OTEZLA 30 MG TABS: 30 | 30 days supply | Qty: 60 | Fill #0

## 2020-04-11 DIAGNOSIS — H5213 Myopia, bilateral: Secondary | ICD-10-CM | POA: Diagnosis not present

## 2020-05-01 MED FILL — OTEZLA 30 MG TABS: 30 | 30 days supply | Qty: 60 | Fill #1

## 2020-05-30 MED FILL — OTEZLA 30 MG TABS: 30 | 30 days supply | Qty: 60 | Fill #2

## 2020-07-02 MED FILL — OTEZLA 30 MG TABS: 30 | 30 days supply | Qty: 60 | Fill #3

## 2020-07-31 MED FILL — OTEZLA 30 MG TABS: 30 | 30 days supply | Qty: 60 | Fill #4

## 2020-09-04 MED FILL — OTEZLA 30 MG TABS: 30 | 30 days supply | Qty: 60 | Fill #5

## 2020-10-02 DIAGNOSIS — Z0000 Encounter for general adult medical examination without abnormal findings: Secondary | ICD-10-CM | POA: Diagnosis not present

## 2020-10-02 DIAGNOSIS — Z6823 Body mass index (BMI) 23.0-23.9, adult: Secondary | ICD-10-CM | POA: Diagnosis not present

## 2020-10-10 MED FILL — OTEZLA 30 MG TABS: 30 | 30 days supply | Qty: 60 | Fill #6

## 2020-12-03 ENCOUNTER — Other Ambulatory Visit (HOSPITAL_COMMUNITY): Payer: Self-pay

## 2020-12-03 MED FILL — Apremilast Tab 30 MG: ORAL | 30 days supply | Qty: 60 | Fill #0 | Status: AC

## 2020-12-08 ENCOUNTER — Other Ambulatory Visit (HOSPITAL_COMMUNITY): Payer: Self-pay

## 2020-12-16 ENCOUNTER — Ambulatory Visit: Payer: 59 | Attending: Family Medicine | Admitting: Pharmacist

## 2020-12-16 DIAGNOSIS — Z79899 Other long term (current) drug therapy: Secondary | ICD-10-CM

## 2020-12-16 NOTE — Progress Notes (Signed)
S: Patient presents for review of his medication.  Patient is currently taking Otezla for psoriasis. Patient is managed by Dr. Rozann Lesches for this.   Adherence: denies any recent missed doses. Reports occasional missed evening dose but is doing better with this.   Efficacy: reports decreased plaque thickness and itching with Otezla  Monitoring: GI upset: denies Neuropsychiatric changes: denies Labs: WNL and monitored regularly  O:  Lab Results  Component Value Date   WBC 3.3 (L) 01/30/2013   HGB 14.7 01/30/2013   HCT 40.9 01/30/2013   MCV 88.3 01/30/2013   PLT 236 01/30/2013      Chemistry      Component Value Date/Time   NA 136 05/13/2008 1215   K 4.2 05/13/2008 1215   CL 99 05/13/2008 1215   CO2 31 05/13/2008 1215   BUN 9 05/13/2008 1215   CREATININE 0.93 05/13/2008 1215      Component Value Date/Time   CALCIUM 9.2 05/13/2008 1215   ALKPHOS 62 05/13/2008 1215   AST 22 05/13/2008 1215   ALT 19 05/13/2008 1215   BILITOT 0.9 05/13/2008 1215       A/P: 1. Medication review: patient on Otezla and tolerating it well with continued control of psoriasis. Denies any side effects. No recommendations for any changes.  Benard Halsted, PharmD, [place], [place] [phone]

## 2021-01-01 DIAGNOSIS — D225 Melanocytic nevi of trunk: Secondary | ICD-10-CM | POA: Diagnosis not present

## 2021-01-01 DIAGNOSIS — L400 Psoriasis vulgaris: Secondary | ICD-10-CM | POA: Diagnosis not present

## 2021-01-01 DIAGNOSIS — Z1283 Encounter for screening for malignant neoplasm of skin: Secondary | ICD-10-CM | POA: Diagnosis not present

## 2021-01-02 ENCOUNTER — Other Ambulatory Visit (HOSPITAL_COMMUNITY): Payer: Self-pay

## 2021-01-02 MED ORDER — OTEZLA 30 MG PO TABS
30.0000 mg | ORAL_TABLET | Freq: Two times a day (BID) | ORAL | 3 refills | Status: DC
  Filled 2021-01-02: qty 180, 90d supply, fill #0

## 2021-01-02 MED ORDER — CLOBETASOL PROPIONATE 0.05 % EX CREA
1.0000 "application " | TOPICAL_CREAM | Freq: Two times a day (BID) | CUTANEOUS | 3 refills | Status: DC | PRN
  Filled 2021-01-02: qty 60, 30d supply, fill #0
  Filled 2021-02-25: qty 60, 60d supply, fill #0

## 2021-01-02 MED FILL — Apremilast Tab 30 MG: ORAL | 30 days supply | Qty: 60 | Fill #1 | Status: AC

## 2021-01-05 ENCOUNTER — Other Ambulatory Visit (HOSPITAL_COMMUNITY): Payer: Self-pay

## 2021-01-08 ENCOUNTER — Other Ambulatory Visit (HOSPITAL_COMMUNITY): Payer: Self-pay | Admitting: *Deleted

## 2021-01-15 ENCOUNTER — Ambulatory Visit (HOSPITAL_COMMUNITY): Admission: RE | Admit: 2021-01-15 | Payer: Self-pay | Source: Ambulatory Visit

## 2021-01-20 ENCOUNTER — Ambulatory Visit (HOSPITAL_COMMUNITY)
Admission: RE | Admit: 2021-01-20 | Discharge: 2021-01-20 | Disposition: A | Discharge status: Home / Self Care | Payer: 59 | Source: Ambulatory Visit | Attending: Cardiology | Admitting: Cardiology

## 2021-01-22 ENCOUNTER — Other Ambulatory Visit (HOSPITAL_COMMUNITY): Payer: Self-pay

## 2021-01-22 MED ORDER — ROSUVASTATIN CALCIUM 20 MG PO TABS
20.0000 mg | ORAL_TABLET | Freq: Every day | ORAL | 4 refills | Status: DC
  Filled 2021-01-22: qty 90, 90d supply, fill #0
  Filled 2021-04-25: qty 90, 90d supply, fill #1
  Filled 2021-07-24: qty 90, 90d supply, fill #2
  Filled 2021-10-23: qty 90, 90d supply, fill #3
  Filled 2022-01-19: qty 90, 90d supply, fill #4

## 2021-01-26 ENCOUNTER — Other Ambulatory Visit (HOSPITAL_COMMUNITY): Payer: Self-pay

## 2021-01-27 ENCOUNTER — Other Ambulatory Visit (HOSPITAL_COMMUNITY): Payer: Self-pay

## 2021-02-02 ENCOUNTER — Other Ambulatory Visit (HOSPITAL_COMMUNITY): Payer: Self-pay

## 2021-02-02 MED FILL — Apremilast Tab 30 MG: ORAL | 30 days supply | Qty: 60 | Fill #2 | Status: AC

## 2021-02-04 ENCOUNTER — Other Ambulatory Visit (HOSPITAL_COMMUNITY): Payer: Self-pay

## 2021-02-25 ENCOUNTER — Other Ambulatory Visit (HOSPITAL_COMMUNITY): Payer: Self-pay

## 2021-02-26 ENCOUNTER — Other Ambulatory Visit (HOSPITAL_COMMUNITY): Payer: Self-pay

## 2021-03-04 ENCOUNTER — Other Ambulatory Visit (HOSPITAL_COMMUNITY): Payer: Self-pay

## 2021-03-04 MED FILL — Apremilast Tab 30 MG: ORAL | 30 days supply | Qty: 60 | Fill #3 | Status: AC

## 2021-03-13 ENCOUNTER — Encounter: Payer: Self-pay | Admitting: *Deleted

## 2021-04-03 ENCOUNTER — Other Ambulatory Visit (HOSPITAL_COMMUNITY): Payer: Self-pay

## 2021-04-06 ENCOUNTER — Other Ambulatory Visit: Payer: Self-pay | Admitting: Pharmacist

## 2021-04-06 ENCOUNTER — Other Ambulatory Visit (HOSPITAL_COMMUNITY): Payer: Self-pay

## 2021-04-06 MED ORDER — OTEZLA 30 MG PO TABS
30.0000 mg | ORAL_TABLET | Freq: Two times a day (BID) | ORAL | 3 refills | Status: DC
  Filled 2021-04-06: qty 60, 30d supply, fill #0
  Filled 2021-05-07: qty 60, 30d supply, fill #1
  Filled 2021-06-03: qty 60, 30d supply, fill #2
  Filled 2021-07-02: qty 60, 30d supply, fill #3
  Filled 2021-07-30: qty 60, 30d supply, fill #4
  Filled 2021-09-02: qty 60, 30d supply, fill #5
  Filled 2021-09-30: qty 60, 30d supply, fill #6
  Filled 2021-11-05: qty 60, 30d supply, fill #7
  Filled 2021-11-26: qty 60, 30d supply, fill #8
  Filled 2021-12-30: qty 60, 30d supply, fill #9
  Filled 2022-01-26: qty 60, 30d supply, fill #10
  Filled 2022-03-03: qty 60, 30d supply, fill #11

## 2021-04-21 DIAGNOSIS — Z79899 Other long term (current) drug therapy: Secondary | ICD-10-CM | POA: Diagnosis not present

## 2021-04-21 DIAGNOSIS — E785 Hyperlipidemia, unspecified: Secondary | ICD-10-CM | POA: Diagnosis not present

## 2021-04-23 DIAGNOSIS — R931 Abnormal findings on diagnostic imaging of heart and coronary circulation: Secondary | ICD-10-CM | POA: Diagnosis not present

## 2021-04-23 DIAGNOSIS — E785 Hyperlipidemia, unspecified: Secondary | ICD-10-CM | POA: Diagnosis not present

## 2021-04-23 DIAGNOSIS — I4510 Unspecified right bundle-branch block: Secondary | ICD-10-CM | POA: Diagnosis not present

## 2021-04-23 DIAGNOSIS — R001 Bradycardia, unspecified: Secondary | ICD-10-CM | POA: Diagnosis not present

## 2021-04-25 ENCOUNTER — Other Ambulatory Visit (HOSPITAL_COMMUNITY): Payer: Self-pay

## 2021-04-28 ENCOUNTER — Other Ambulatory Visit (HOSPITAL_COMMUNITY): Payer: Self-pay

## 2021-05-01 ENCOUNTER — Other Ambulatory Visit (HOSPITAL_COMMUNITY): Payer: Self-pay

## 2021-05-06 ENCOUNTER — Other Ambulatory Visit (INDEPENDENT_AMBULATORY_CARE_PROVIDER_SITE_OTHER): Payer: 59

## 2021-05-06 ENCOUNTER — Other Ambulatory Visit: Payer: Self-pay

## 2021-05-06 DIAGNOSIS — Z23 Encounter for immunization: Secondary | ICD-10-CM | POA: Diagnosis not present

## 2021-05-07 ENCOUNTER — Other Ambulatory Visit (HOSPITAL_COMMUNITY): Payer: Self-pay

## 2021-05-08 ENCOUNTER — Other Ambulatory Visit (HOSPITAL_COMMUNITY): Payer: Self-pay

## 2021-06-03 ENCOUNTER — Other Ambulatory Visit (HOSPITAL_COMMUNITY): Payer: Self-pay

## 2021-06-09 ENCOUNTER — Other Ambulatory Visit (HOSPITAL_COMMUNITY): Payer: Self-pay

## 2021-06-25 NOTE — Progress Notes (Signed)
Cardiology Office Note   Date:  06/26/2021   ID:  Kathyrn Drown, DOB [birth year], MRN [id number]  PCP:  Asencion Noble, MD  Cardiologist:   Minus Breeding, MD Referring:  Asencion Noble, MD  Chief Complaint  Patient presents with   Shortness of Breath      History of Present Illness: Gary Fischer is a 60 y.o. male who presents for evaluation of elevated coronary calcium.   He does occasionally get some shortness of breath running.  Otherwise he feels very well.  He denies any chest pressure, neck or arm discomfort.  He denies any shortness of breath, PND or orthopnea.  Has had no weight gain or edema.  He had a negative POET (Plain Old Exercise Treadmill) in 2016.   He did have a calcium score earlier this year.  The LAD had score of 119, circumflex 26, right coronary artery 0.  Total was 145 which was 76 percentile.    MESA Score is 7.8.    Past Medical History:  Diagnosis Date   Psoriasis     Past Surgical History:  Procedure Laterality Date   COLONOSCOPY N/A 02/27/2016   Procedure: COLONOSCOPY;  Surgeon: Daneil Dolin, MD;  [facility];  Service: Endoscopy;  Laterality: N/A;  7:30 Am   NOSE SURGERY     devated septum     Current Outpatient Medications  Medication Sig Dispense Refill   Apremilast (OTEZLA) 30 MG TABS Take 1 tablet (30 mg total) by mouth 2 (two) times daily with food. 180 tablet 3   clobetasol cream (TEMOVATE) 4.62 % Apply 1 application to the affected area topically up to 2 (two) times daily as needed. Do not apply to face, groin, or under arms. 60 g 3   rosuvastatin (CRESTOR) 20 MG tablet Take 1 tablet (20 mg total) by mouth daily. 90 tablet 4   amoxicillin (AMOXIL) 500 MG tablet Take one po TID (Patient not taking: Reported on 06/26/2021) 30 tablet 0   Na Sulfate-K Sulfate-Mg Sulf (SUPREP BOWEL PREP KIT) 17.5-3.13-1.6 GM/180ML SOLN Take 1 kit by mouth as directed. (Patient not taking: Reported on 06/26/2021) 1 Bottle 0   ondansetron (ZOFRAN) 4 MG  tablet Take 4 mg by mouth every 6 (six) hours. (Patient not taking: Reported on 06/26/2021)     No current facility-administered medications for this visit.    Allergies:   Other    Social History:  The patient  reports that he has never smoked. He has never used smokeless tobacco. He reports that he does not drink alcohol and does not use drugs.   Family History:  The patient's family history includes Alzheimer's disease in his mother; Hyperlipidemia in his father and mother.    ROS:  Please see the history of present illness.   Otherwise, review of systems are positive for none.   All other systems are reviewed and negative.    PHYSICAL EXAM: VS:  BP 122/87   Ht 6' 0.5" (1.842 m)   Wt 178 lb 12.8 oz (81.1 kg)   SpO2 97%   BMI 23.92 kg/m  , BMI Body mass index is 23.92 kg/m. GENERAL:  Well appearing HEENT:  Pupils equal round and reactive, fundi not visualized, oral mucosa unremarkable NECK:  No jugular venous distention, waveform within normal limits, carotid upstroke brisk and symmetric, no bruits, no thyromegaly LYMPHATICS:  No cervical, inguinal adenopathy LUNGS:  Clear to auscultation bilaterally BACK:  No CVA tenderness CHEST:  Unremarkable HEART:  PMI not displaced or sustained,S1 and S2 within normal limits, no S3, no S4, no clicks, no rubs, soft apical systolic murmur also heard slightly at the left upper sternal border, no diastolic murmurs ABD:  Flat, positive bowel sounds normal in frequency in pitch, no bruits, no rebound, no guarding, no midline pulsatile mass, no hepatomegaly, no splenomegaly EXT:  2 plus pulses throughout, no edema, no cyanosis no clubbing SKIN:  No rashes no nodules NEURO:  Cranial nerves II through XII grossly intact, motor grossly intact throughout PSYCH:  Cognitively intact, oriented to person place and time    EKG:  EKG is ordered today. The ekg ordered today demonstrates sinus rhythm, rate 62, axis within normal limits, intervals within  normal limits, incomplete right bundle branch block   Recent Labs: No results found for requested labs within last 8760 hours.    Lipid Panel    Component Value Date/Time   CHOL 179 06/18/2014 0850   TRIG 72 06/18/2014 0850   HDL 43 06/18/2014 0850   CHOLHDL 4.2 06/18/2014 0850   VLDL 14 06/18/2014 0850   LDLCALC 122 (H) 06/18/2014 0850      Wt Readings from Last 3 Encounters:  06/26/21 178 lb 12.8 oz (81.1 kg)  02/27/16 167 lb (75.8 kg)  01/08/15 169 lb 6.4 oz (76.8 kg)      Other studies Reviewed: Additional studies/ records that were reviewed today include: Labs. Review of the above records demonstrates:  Please see elsewhere in the note.     ASSESSMENT AND PLAN:  Elevated coronary calcium: Because of this and some mild shortness of breath I will bring him back for a POET (Plain Old Exercise Treadmill).  This should be maximal.  Further management will be based on these results.  He needs continued primary risk reduction.  Dyslipidemia: His LDL recently was 133 with a total of 200.  He was started on 20 mg of Crestor and his LDL is now 62.  I agree with statin we talked also about a Mediterranean plant-based diet.   Current medicines are reviewed at length with the patient today.  The patient does not have concerns regarding medicines.  The following changes have been made:  no change  Labs/ tests ordered today include:   Orders Placed This Encounter  Procedures   EXERCISE TOLERANCE TEST (ETT)   EKG 12-Lead      Disposition:   FU with me in one year.     Signed, Minus Breeding, MD  06/26/2021 3:16 PM    [facility]

## 2021-06-26 ENCOUNTER — Other Ambulatory Visit: Payer: Self-pay

## 2021-06-26 ENCOUNTER — Encounter: Payer: Self-pay | Admitting: Cardiology

## 2021-06-26 ENCOUNTER — Ambulatory Visit: Payer: 59 | Admitting: Cardiology

## 2021-06-26 VITALS — BP 122/87 | Ht 72.5 in | Wt 178.8 lb

## 2021-06-26 DIAGNOSIS — R0602 Shortness of breath: Secondary | ICD-10-CM | POA: Diagnosis not present

## 2021-06-26 NOTE — Patient Instructions (Addendum)
Medication Instructions:  Your physician recommends that you continue on your current medications as directed. Please refer to the Current Medication list given to you today.  *If you need a refill on your cardiac medications before your next appointment, please call your pharmacy*  Lab Work: NONE ordered at this time of appointment   If you have labs (blood work) drawn today and your tests are completely normal, you will receive your results only by: [place] (if you have MyChart) OR A paper copy in the mail If you have any lab test that is abnormal or we need to change your treatment, we will call you to review the results.  Testing/Procedures: Your physician has requested that you have an exercise tolerance test. For further information please visit [email or web address]. Please also follow instruction sheet, as given.  Please schedule for within 1-2 months   Follow-Up: At [facility], you and your health needs are our priority.  As part of our continuing mission to provide you with exceptional heart care, we have created designated Provider Care Teams.  These Care Teams include your primary Cardiologist (physician) and Advanced Practice Providers (APPs -  Physician Assistants and Nurse Practitioners) who all work together to provide you with the care you need, when you need it.  Your next appointment:   1 year(s)  The format for your next appointment:   In Person  Provider:   Minus Breeding, MD    Other Instructions

## 2021-07-02 ENCOUNTER — Other Ambulatory Visit (HOSPITAL_COMMUNITY): Payer: Self-pay

## 2021-07-02 DIAGNOSIS — D225 Melanocytic nevi of trunk: Secondary | ICD-10-CM | POA: Diagnosis not present

## 2021-07-02 DIAGNOSIS — L308 Other specified dermatitis: Secondary | ICD-10-CM | POA: Diagnosis not present

## 2021-07-02 DIAGNOSIS — L400 Psoriasis vulgaris: Secondary | ICD-10-CM | POA: Diagnosis not present

## 2021-07-09 ENCOUNTER — Other Ambulatory Visit (HOSPITAL_COMMUNITY): Payer: Self-pay

## 2021-07-13 NOTE — Progress Notes (Signed)
[place] [place] [facility] Phone: [phone] Subjective:   [name], am serving as a scribe for Dr. Hulan Saas.  This visit occurred during the SARS-CoV-2 public health emergency.  Safety protocols were in place, including screening questions prior to the visit, additional usage of staff PPE, and extensive cleaning of exam room while observing appropriate contact time as indicated for disinfecting solutions.   I'm seeing this patient by the request  of:  Asencion Noble, MD  CC: Foot pain  WLS:[id number]  Gary Fischer is a 60 y.o. male coming in with complaint of proximal R base of 5th pain for 6 weeks. Runs 3-5x a week. Has been biking more now without pain. Pain occurs with standing and walking. Has not tried NSAIDs. Runs in [place] and no changes in footwear recently.     Past Medical History:  Diagnosis Date   Psoriasis    Past Surgical History:  Procedure Laterality Date   COLONOSCOPY N/A 02/27/2016   Procedure: COLONOSCOPY;  Surgeon: Daneil Dolin, MD;  [facility];  Service: Endoscopy;  Laterality: N/A;  7:30 Am   NOSE SURGERY     devated septum   Social History   Socioeconomic History   Marital status: Married    Spouse name: Not on file   Number of children: Not on file   Years of education: Not on file   Highest education level: Not on file  Occupational History   Not on file  Tobacco Use   Smoking status: Never   Smokeless tobacco: Never  Substance and Sexual Activity   Alcohol use: No    Alcohol/week: 0.0 standard drinks   Drug use: No   Sexual activity: Not on file  Other Topics Concern   Not on file  Social History Narrative   Not on file   Social Determinants of Health   Financial Resource Strain: Not on file  Food Insecurity: Not on file  Transportation Needs: Not on file  Physical Activity: Not on file  Stress: Not on file  Social Connections: Not on file   Allergies   Allergen Reactions   Other     Hazelnut    Family History  Problem Relation Age of Onset   Hyperlipidemia Mother    Alzheimer's disease Mother    Hyperlipidemia Father      Current Outpatient Medications (Cardiovascular):    rosuvastatin (CRESTOR) 20 MG tablet, Take 1 tablet (20 mg total) by mouth daily.   Current Outpatient Medications (Analgesics):    Apremilast (OTEZLA) 30 MG TABS, Take 1 tablet (30 mg total) by mouth 2 (two) times daily with food.   Current Outpatient Medications (Other):    clobetasol cream (TEMOVATE) 1.15 %, Apply 1 application to the affected area topically up to 2 (two) times daily as needed. Do not apply to face, groin, or under arms.   Na Sulfate-K Sulfate-Mg Sulf (SUPREP BOWEL PREP KIT) 17.5-3.13-1.6 GM/180ML SOLN, Take 1 kit by mouth as directed.   ondansetron (ZOFRAN) 4 MG tablet, Take 4 mg by mouth every 6 (six) hours.   Vitamin D, Ergocalciferol, (DRISDOL) 1.25 MG (50000 UNIT) CAPS capsule, Take 1 capsule (50,000 Units total) by mouth every 7 (seven) days.   amoxicillin (AMOXIL) 500 MG tablet, Take one po TID (Patient not taking: Reported on 06/26/2021)   Reviewed prior external information including notes and imaging from  primary care provider As well as notes that were available from  care everywhere and other healthcare systems.  Past medical history, social, surgical and family history all reviewed in electronic medical record.  No pertanent information unless stated regarding to the chief complaint.   Review of Systems:  No headache, visual changes, nausea, vomiting, diarrhea, constipation, dizziness, abdominal pain, skin rash, fevers, chills, night sweats, weight loss, swollen lymph nodes, body aches, joint swelling, chest pain, shortness of breath, mood changes. POSITIVE muscle aches  Objective  Blood pressure 118/82, pulse 71, height 6' 0.5" (1.842 m), weight 177 lb (80.3 kg), SpO2 97 %.   General: No apparent distress alert and  oriented x3 mood and affect normal, dressed appropriately.  HEENT: Pupils equal, extraocular movements intact  Respiratory: Patient's speak in full sentences and does not appear short of breath  Cardiovascular: No lower extremity edema, non tender, no erythema  Gait normal with good balance and coordination.  MSK: Foot exam shows patient does have some hypertrophy of the base of the fifth metatarsal.  Minorly tender to palpation in the area.  Negative squeeze test of the metatarsals the patient does have a breakdown of the transverse arch.  Limited muscular skeletal ultrasound was performed and interpreted by Hulan Saas, M   Limited ultrasound shows the patient does have some hypoechoic changes of the proximal fifth metatarsal knee joint.  Patient also has OA mild cortical irregularity noted.  Consistent with hypoechoic changes and increasing Doppler flow for possible stress reaction.  Patient also has a potential hypoechoic changes noted or periosteal edema proximal fourth metatarsal. Impression: Stress reaction of the fifth metatarsal with synovitis with questionable early signs on the fourth metatarsal.   Impression and Recommendations:     The above documentation has been reviewed and is accurate and complete Lyndal Pulley, DO

## 2021-07-14 ENCOUNTER — Other Ambulatory Visit (HOSPITAL_COMMUNITY): Payer: Self-pay

## 2021-07-14 ENCOUNTER — Ambulatory Visit: Payer: Self-pay

## 2021-07-14 ENCOUNTER — Encounter: Payer: Self-pay | Admitting: Family Medicine

## 2021-07-14 ENCOUNTER — Other Ambulatory Visit: Payer: Self-pay

## 2021-07-14 ENCOUNTER — Ambulatory Visit: Payer: 59 | Admitting: Family Medicine

## 2021-07-14 VITALS — BP 118/82 | HR 71 | Ht 72.5 in | Wt 177.0 lb

## 2021-07-14 DIAGNOSIS — M84374A Stress fracture, right foot, initial encounter for fracture: Secondary | ICD-10-CM

## 2021-07-14 DIAGNOSIS — M79671 Pain in right foot: Secondary | ICD-10-CM | POA: Diagnosis not present

## 2021-07-14 MED ORDER — VITAMIN D (ERGOCALCIFEROL) 1.25 MG (50000 UNIT) PO CAPS
50000.0000 [IU] | ORAL_CAPSULE | ORAL | 0 refills | Status: DC
  Filled 2021-07-14: qty 12, 84d supply, fill #0

## 2021-07-14 NOTE — Assessment & Plan Note (Signed)
Appears to have a stress fracture of the fifth metatarsal.  Also on ultrasound does noted that patient does have a synovitis noted.  Gust with patient about vitamin D supplementation, icing regimen, did not talk to him specifically but anti-inflammatories could also be beneficial.  Discussed icing regimen.  Discussed a carbon fiber plate and how this could be beneficial as well.  Increase activity slowly.  Will try lower impact exercises and avoid running until we see patient again in 4 to 6 weeks.  At that time hopefully we will see improvement in callus formation and can advance patient accordingly.

## 2021-07-14 NOTE — Patient Instructions (Addendum)
Carbon fiber plate in regular shoes [place] recovery sandals for house Vit D Prescribed K2 200 micrograms daily for 4 weeks Stick to biking for most of cardio Still ice afterwards When shoe shopping [place] [place] [place], or Ernestina Patches  Send me message in 2 weeks See you again in 4-6 weeks okay to double book

## 2021-07-24 ENCOUNTER — Encounter: Payer: Self-pay | Admitting: Cardiology

## 2021-07-24 ENCOUNTER — Telehealth (HOSPITAL_COMMUNITY): Payer: Self-pay | Admitting: Cardiology

## 2021-07-24 ENCOUNTER — Other Ambulatory Visit (HOSPITAL_COMMUNITY): Payer: Self-pay

## 2021-07-24 NOTE — Telephone Encounter (Signed)
Patient cancelled GXT see below:  07/24/2021 2:54 PM KK:DPTELMRA, JULIE T  Cancel Rsn: Patient  Order will be removed from the Active Wq and when pt calls back we will reinstate the order.

## 2021-07-24 NOTE — Addendum Note (Signed)
Addended by: Betha Loa F on: 07/24/2021 01:33 PM   Modules accepted: Orders

## 2021-07-30 ENCOUNTER — Inpatient Hospital Stay (HOSPITAL_COMMUNITY): Admission: RE | Admit: 2021-07-30 | Payer: 59 | Source: Ambulatory Visit

## 2021-07-30 ENCOUNTER — Other Ambulatory Visit (HOSPITAL_COMMUNITY): Payer: Self-pay

## 2021-08-01 NOTE — Addendum Note (Signed)
Addended by: Minus Breeding on: 08/01/2021 09:18 PM   Modules accepted: Orders

## 2021-08-10 ENCOUNTER — Other Ambulatory Visit (HOSPITAL_COMMUNITY): Payer: Self-pay

## 2021-08-25 IMAGING — CT CT CARDIAC CORONARY ARTERY CALCIUM SCORE
2 series · 15 of 20 positions shown, 17 images · non-contrast
Comparison: None.
COMPARISON: None.

Addendum:
EXAM:
OVER-READ INTERPRETATION  CT CHEST

The following report is an over-read performed by radiologist Dr.
Kj Hofmann [REDACTED] on 01/20/2021. This
over-read does not include interpretation of cardiac or coronary
anatomy or pathology. The coronary calcium score interpretation by
the cardiologist is attached.
CLINICAL DATA: Cardiovascular disease risk stratification
CT Coronary Calcium Score
TECHNIQUE: A gated, non-contrast computed tomography scan of the heart was
performed using 3mm slice thickness. Axial images were analyzed on a
dedicated workstation. Calcium scoring of the coronary arteries was
performed using the Agatston method.

[Series 2: ct hrt calcium 70 % · axial · 0.63mm/px · z∈[-260,-134]mm · 8 of 54 slices shown, 10 images]
[im 6/54  vessel]
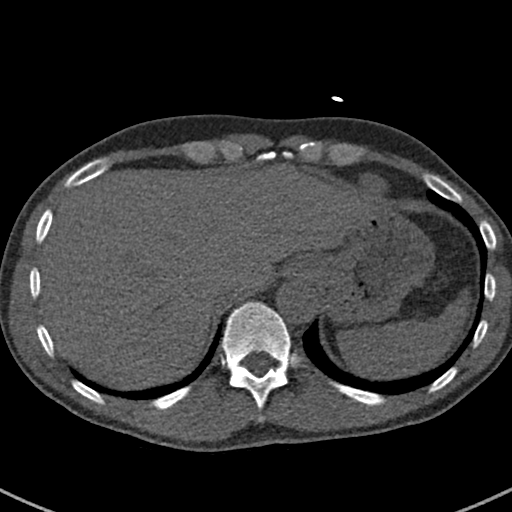
[im 6/54  lung]
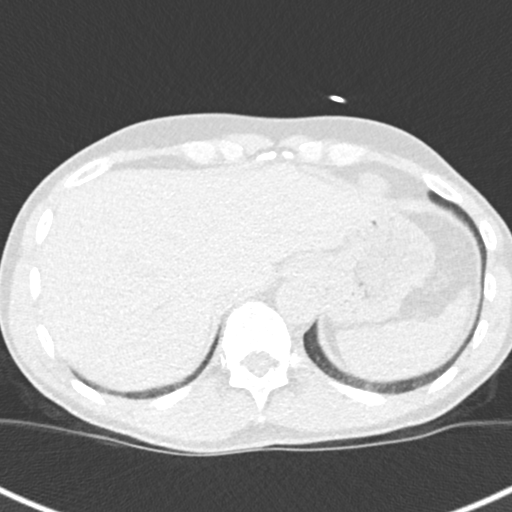
[im 12/54  vessel]
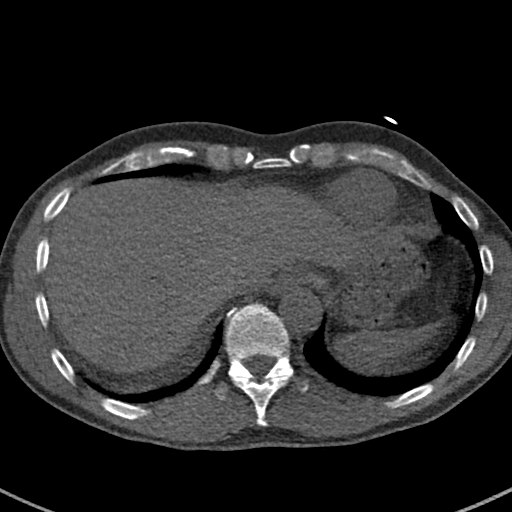
[im 18/54  vessel]
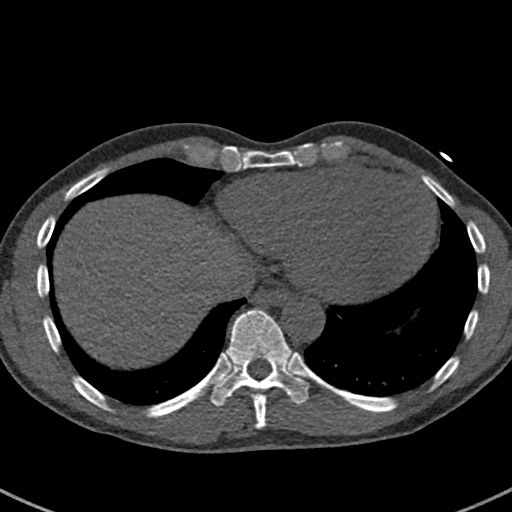
[im 24/54  vessel]
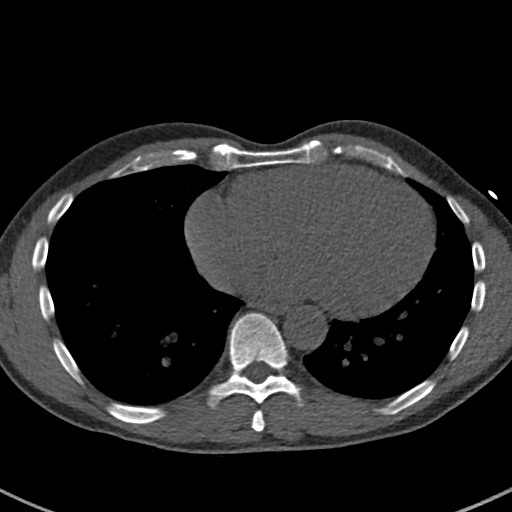
[im 30/54  vessel]
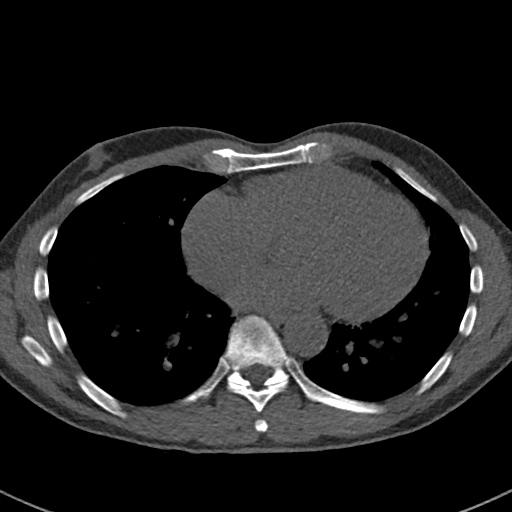
[im 30/54  lung]
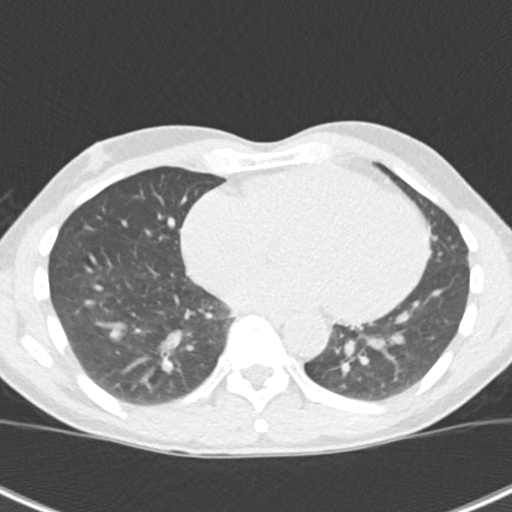
[im 36/54  vessel]
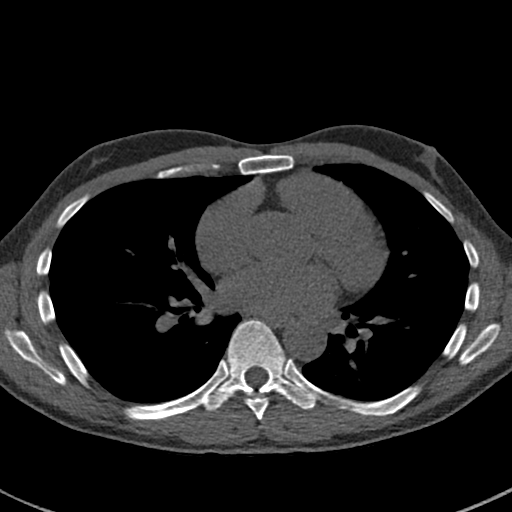
[im 42/54  vessel]
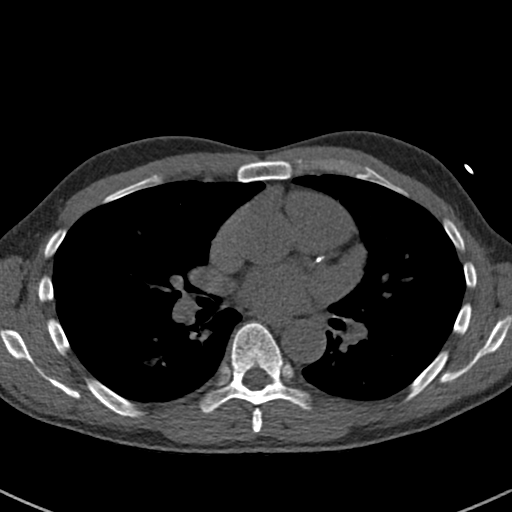
[im 48/54  vessel]
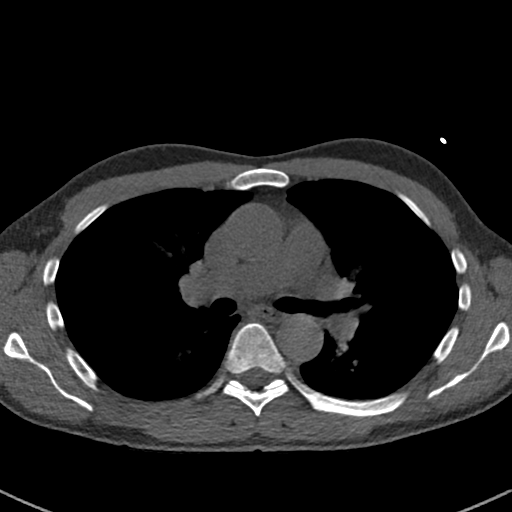

[Series 3: soft full fov 70 % · axial · 0.63mm/px · z∈[-260,-152]mm · 7 of 54 slices shown]
[im 6/54  vessel]
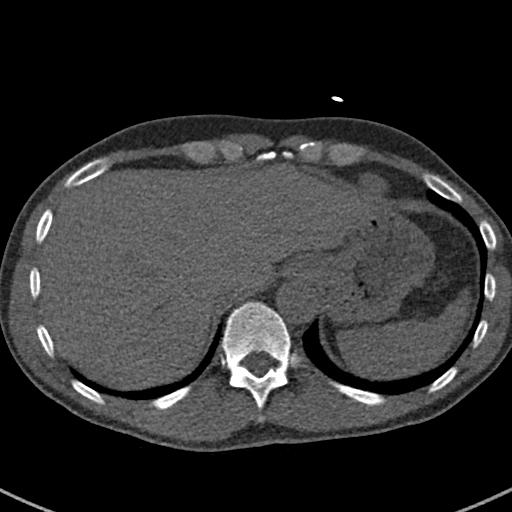
[im 12/54  vessel]
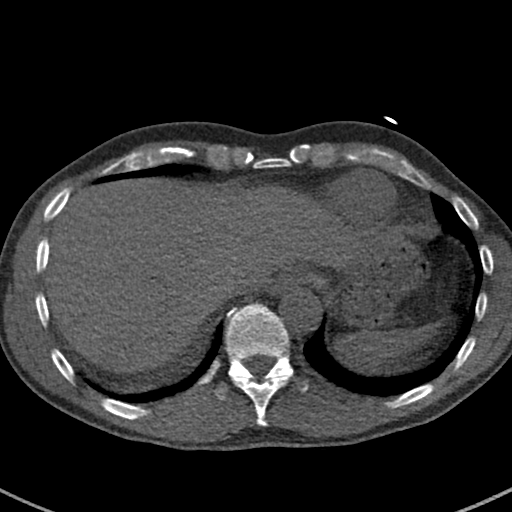
[im 18/54  vessel]
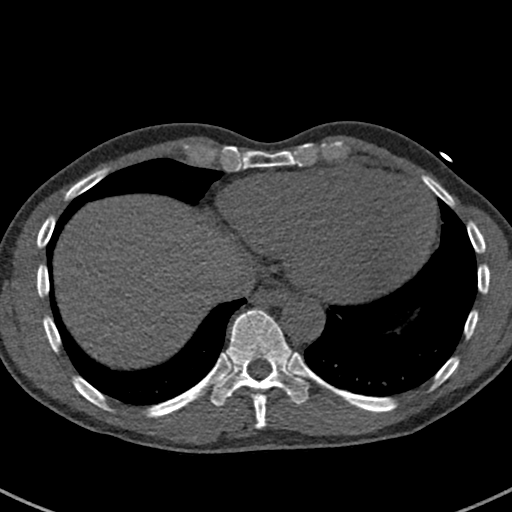
[im 24/54  vessel]
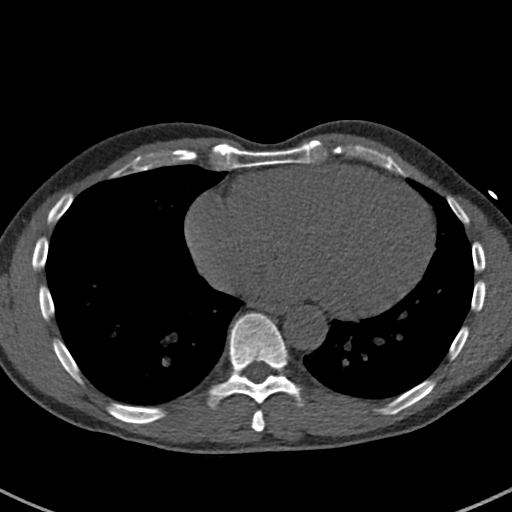
[im 30/54  vessel]
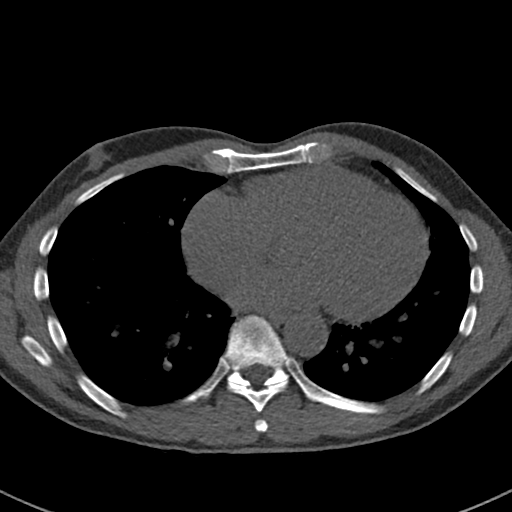
[im 36/54  vessel]
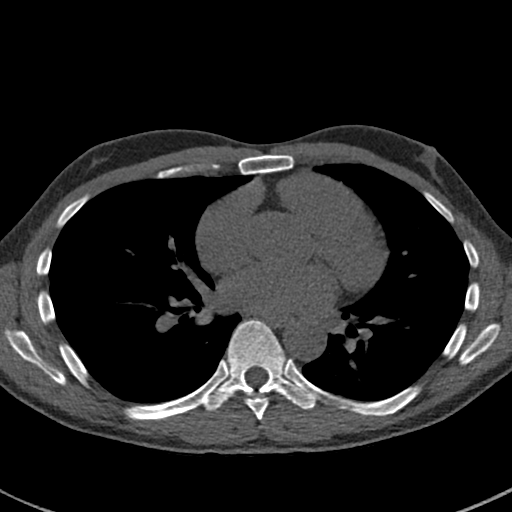
[im 42/54  vessel]
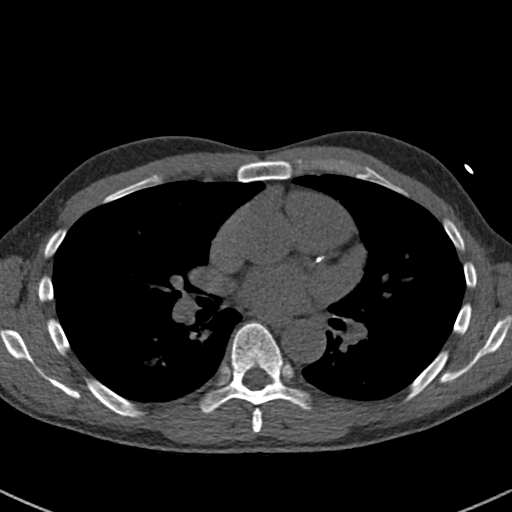

[15 of 20 positions shown; findings below may reference images not displayed]

FINDINGS: Several small densely calcified left hilar lymph nodes are
incidentally noted. Within the visualized portions of the thorax
there are no suspicious appearing pulmonary nodules or masses, there
is no acute consolidative airspace disease, no pleural effusions, no
pneumothorax and no lymphadenopathy. Visualized portions of the
upper abdomen are unremarkable. There are no aggressive appearing
lytic or blastic lesions noted in the visualized portions of the
skeleton.
IMPRESSION: No significant incidental noncardiac findings are noted.
FINDINGS: Coronary arteries: Normal origins.

Coronary Calcium Score:

Left main: 0

Left anterior descending artery: 119, proximal.

Left circumflex artery: 26, proximal.

Right coronary artery: 0

Total: 145

Percentile: 76th

Pericardium: Normal.

Ascending Aorta: Normal caliber. Ascending aorta measures 35mm at
the mid ascending aorta measured in an axial plane.

Non-cardiac: See separate report from [REDACTED].
IMPRESSION: Coronary calcium score of 145. This was 76th percentile for age-,
race-, and sex-matched controls.



If CAC=0, it is reasonable to withhold statin therapy and reassess
in 5 to 10 years, as long as higher risk conditions are absent
(diabetes mellitus, family history of premature CHD in first degree
relatives (males <55 years; females <65 years), cigarette smoking,
or LDL >=190 mg/dL).

If CAC is 1 to 99, it is reasonable to initiate statin therapy for
patients >=55 years of age.

If CAC is >=100 or >=75th percentile, it is reasonable to initiate
statin therapy at any age.

Cardiology referral should be considered for patients with CAC
scores >=400 or >=75th percentile.

*5742 AHA/ACC/AACVPR/AAPA/ABC/FATIMA SOUSA/MAYIMOUNATA/BHEBHE/Biplabm/ESMIRA/LEONARDI/KIM PRETINHO
Guideline on the Management of Blood Cholesterol: A Report of the
American College of Cardiology/American Heart Association Task Force
on Clinical Practice Guidelines. J Am Coll Cardiol.
5489;73(24):3177-3017.

*** End of Addendum ***
EXAM:
OVER-READ INTERPRETATION  CT CHEST

The following report is an over-read performed by radiologist Dr.
Kj Hofmann [REDACTED] on 01/20/2021. This
over-read does not include interpretation of cardiac or coronary
anatomy or pathology. The coronary calcium score interpretation by
the cardiologist is attached.
FINDINGS: Several small densely calcified left hilar lymph nodes are
incidentally noted. Within the visualized portions of the thorax
there are no suspicious appearing pulmonary nodules or masses, there
is no acute consolidative airspace disease, no pleural effusions, no
pneumothorax and no lymphadenopathy. Visualized portions of the
upper abdomen are unremarkable. There are no aggressive appearing
lytic or blastic lesions noted in the visualized portions of the
skeleton.
IMPRESSION: No significant incidental noncardiac findings are noted.

## 2021-09-02 ENCOUNTER — Other Ambulatory Visit (HOSPITAL_COMMUNITY): Payer: Self-pay

## 2021-09-03 ENCOUNTER — Encounter: Payer: Self-pay | Admitting: Family Medicine

## 2021-09-09 ENCOUNTER — Other Ambulatory Visit (HOSPITAL_COMMUNITY): Payer: Self-pay

## 2021-09-16 NOTE — Progress Notes (Signed)
[place] Phone: [phone] Subjective:   [name], am serving as a scribe for Dr. Hulan Saas.This visit occurred during the SARS-CoV-2 public health emergency.  Safety protocols were in place, including screening questions prior to the visit, additional usage of staff PPE, and extensive cleaning of exam room while observing appropriate contact time as indicated for disinfecting solutions.  I'm seeing this patient by the request  of:  Asencion Noble, MD  CC: 5th mtp  [id number]  07/14/2021 Appears to have a stress fracture of the fifth metatarsal.  Also on ultrasound does noted that patient does have a synovitis noted.  Gust with patient about vitamin D supplementation, icing regimen, did not talk to him specifically but anti-inflammatories could also be beneficial.  Discussed icing regimen.  Discussed a carbon fiber plate and how this could be beneficial as well.  Increase activity slowly.  Will try lower impact exercises and avoid running until we see patient again in 4 to 6 weeks.  At that time hopefully we will see improvement in callus formation and can advance patient accordingly.  Updated 09/17/2021 Gary Fischer is a 61 y.o. male coming in with complaint of R foot fx f/u. Patient states that he has been using carbon fiber plate in shoes while at work. Wearing recovery shoes at work. Has not been running. Using stationary bike. Does feel like he is slowly making progress. Did use K2 for one month. Continues using once weekly Vit D.  Patient mentions having psoriasis and wonders if he will develop psoriatic arthritis in various areas of body such as base of 5th.        Past Medical History:  Diagnosis Date   Psoriasis    Past Surgical History:  Procedure Laterality Date   COLONOSCOPY N/A 02/27/2016   Procedure: COLONOSCOPY;  Surgeon: Daneil Dolin, MD;  Location: [facility];  Service: Endoscopy;   Laterality: N/A;  7:30 Am   NOSE SURGERY     devated septum   Social History   Socioeconomic History   Marital status: Married    Spouse name: Not on file   Number of children: Not on file   Years of education: Not on file   Highest education level: Not on file  Occupational History   Not on file  Tobacco Use   Smoking status: Never   Smokeless tobacco: Never  Substance and Sexual Activity   Alcohol use: No    Alcohol/week: 0.0 standard drinks   Drug use: No   Sexual activity: Not on file  Other Topics Concern   Not on file  Social History Narrative   Not on file   Social Determinants of Health   Financial Resource Strain: Not on file  Food Insecurity: Not on file  Transportation Needs: Not on file  Physical Activity: Not on file  Stress: Not on file  Social Connections: Not on file   Allergies  Allergen Reactions   Other     Hazelnut    Family History  Problem Relation Age of Onset   Hyperlipidemia Mother    Alzheimer's disease Mother    Hyperlipidemia Father      Current Outpatient Medications (Cardiovascular):    rosuvastatin (CRESTOR) 20 MG tablet, Take 1 tablet (20 mg total) by mouth daily.   Current Outpatient Medications (Analgesics):    Apremilast (OTEZLA) 30 MG TABS, Take 1 tablet (30 mg total) by mouth 2 (two) times  daily with food.   Current Outpatient Medications (Other):    clobetasol cream (TEMOVATE) 1.69 %, Apply 1 application to the affected area topically up to 2 (two) times daily as needed. Do not apply to face, groin, or under arms.   Na Sulfate-K Sulfate-Mg Sulf (SUPREP BOWEL PREP KIT) 17.5-3.13-1.6 GM/180ML SOLN, Take 1 kit by mouth as directed.   ondansetron (ZOFRAN) 4 MG tablet, Take 4 mg by mouth every 6 (six) hours.   Vitamin D, Ergocalciferol, (DRISDOL) 1.25 MG (50000 UNIT) CAPS capsule, Take 1 capsule (50,000 Units total) by mouth every 7 (seven) days.   amoxicillin (AMOXIL) 500 MG tablet, Take one po TID (Patient not taking:  Reported on 06/26/2021)    Objective  Blood pressure 128/72, pulse 73, height 6' (1.829 m), weight 185 lb (83.9 kg), SpO2 98 %.   General: No apparent distress alert and oriented x3 mood and affect normal, dressed appropriately.  HEENT: Pupils equal, extraocular movements intact  Respiratory: Patient's speak in full sentences and does not appear short of breath  Cardiovascular: No lower extremity edema, non tender, no erythema  Gait normal with good balance and coordination.  MSK: Patient's right foot still has a bony abnormality noted of the fifth metatarsal.  Patient is nontender though on exam.  Full range of motion of the ankle.  Limited muscular skeletal ultrasound was performed and interpreted by Hulan Saas, M  Limited ultrasound of the right foot shows patient does not have any of the cortical irregularity that was noted previously of the fifth metatarsal.  Patient does have the hypoechoic changes still noted of the joint space.  Patient also has some overlying hypoechoic changes that seem to be some mild interstitial tearing noted of the peroneal tendon with some mild increase in Doppler flow.    Impression and Recommendations:     The above documentation has been reviewed and is accurate and complete Gary Pulley, DO

## 2021-09-17 ENCOUNTER — Ambulatory Visit: Payer: 59 | Admitting: Family Medicine

## 2021-09-17 ENCOUNTER — Other Ambulatory Visit: Payer: Self-pay

## 2021-09-17 ENCOUNTER — Ambulatory Visit: Payer: Self-pay

## 2021-09-17 ENCOUNTER — Encounter: Payer: Self-pay | Admitting: Family Medicine

## 2021-09-17 VITALS — BP 128/72 | HR 73 | Ht 72.0 in | Wt 185.0 lb

## 2021-09-17 DIAGNOSIS — M79671 Pain in right foot: Secondary | ICD-10-CM

## 2021-09-17 DIAGNOSIS — M84374A Stress fracture, right foot, initial encounter for fracture: Secondary | ICD-10-CM

## 2021-09-17 NOTE — Patient Instructions (Signed)
Body helix x active ankle brace Continue recovery sandals in house okay to start jogging regimen 3x a week starting with 1 min jog then 1 min walk Every week add one more min to jog Ice after activity Continue vit d MyChart me in a month

## 2021-09-17 NOTE — Assessment & Plan Note (Signed)
Seems at the stress reaction .Marland Kitchen  Patient does have a effusion of the fifth metatarsal noted.  We discussed the possibility of injection but at the moment patient would like to continue with conservative therapy.  We will continue with the vitamin D supplementation.  Continue with the carbon fiber plate in his regular shoes.  Follow-up with me again in 6 to 8 weeks if necessary.  Patient was given a running progression and I think will do relatively well overall.

## 2021-09-30 ENCOUNTER — Other Ambulatory Visit (HOSPITAL_COMMUNITY): Payer: Self-pay

## 2021-10-08 ENCOUNTER — Other Ambulatory Visit (HOSPITAL_COMMUNITY): Payer: Self-pay

## 2021-10-23 ENCOUNTER — Other Ambulatory Visit (HOSPITAL_COMMUNITY): Payer: Self-pay

## 2021-10-26 ENCOUNTER — Other Ambulatory Visit (HOSPITAL_COMMUNITY): Payer: Self-pay

## 2021-11-05 ENCOUNTER — Other Ambulatory Visit (HOSPITAL_COMMUNITY): Payer: Self-pay

## 2021-11-25 ENCOUNTER — Other Ambulatory Visit: Payer: Self-pay | Admitting: *Deleted

## 2021-11-25 ENCOUNTER — Other Ambulatory Visit (HOSPITAL_COMMUNITY): Payer: Self-pay

## 2021-11-25 ENCOUNTER — Encounter: Payer: Self-pay | Admitting: Cardiology

## 2021-11-25 DIAGNOSIS — N401 Enlarged prostate with lower urinary tract symptoms: Secondary | ICD-10-CM | POA: Diagnosis not present

## 2021-11-25 DIAGNOSIS — R0602 Shortness of breath: Secondary | ICD-10-CM

## 2021-11-25 MED ORDER — TAMSULOSIN HCL 0.4 MG PO CAPS
ORAL_CAPSULE | ORAL | 4 refills | Status: DC
  Filled 2021-11-25: qty 90, 90d supply, fill #0

## 2021-11-25 MED ORDER — SILDENAFIL CITRATE 100 MG PO TABS
ORAL_TABLET | ORAL | 3 refills | Status: DC
  Filled 2021-11-25 (×2): qty 20, 20d supply, fill #0

## 2021-11-25 MED ORDER — CLONAZEPAM 0.5 MG PO TABS
ORAL_TABLET | ORAL | 1 refills | Status: DC
  Filled 2021-11-25: qty 20, 20d supply, fill #0

## 2021-11-26 ENCOUNTER — Other Ambulatory Visit (HOSPITAL_COMMUNITY): Payer: Self-pay

## 2021-11-27 ENCOUNTER — Other Ambulatory Visit (HOSPITAL_COMMUNITY): Payer: Self-pay

## 2021-12-08 ENCOUNTER — Other Ambulatory Visit (HOSPITAL_COMMUNITY): Payer: Self-pay

## 2021-12-09 ENCOUNTER — Telehealth (HOSPITAL_COMMUNITY): Payer: Self-pay | Admitting: *Deleted

## 2021-12-09 NOTE — Telephone Encounter (Signed)
Close encounter 

## 2021-12-10 ENCOUNTER — Ambulatory Visit (HOSPITAL_COMMUNITY)
Admission: RE | Admit: 2021-12-10 | Discharge: 2021-12-10 | Disposition: A | Discharge status: Home / Self Care | Payer: 59 | Source: Ambulatory Visit | Attending: Cardiology | Admitting: Cardiology

## 2021-12-10 DIAGNOSIS — R0602 Shortness of breath: Secondary | ICD-10-CM | POA: Insufficient documentation

## 2021-12-10 LAB — EXERCISE TOLERANCE TEST
Angina Index: 0
Base ST Depression (mm): 0 mm
Duke Treadmill Score: 14
Estimated workload: 16.7
Exercise duration (min): 13 min
Exercise duration (sec): 48 s
MPHR: 160 {beats}/min
Peak HR: 148 {beats}/min
Percent HR: 92 %
Rest HR: 62 {beats}/min
ST Depression (mm): 0 mm

## 2021-12-28 ENCOUNTER — Other Ambulatory Visit (HOSPITAL_COMMUNITY): Payer: Self-pay

## 2021-12-30 ENCOUNTER — Other Ambulatory Visit (HOSPITAL_COMMUNITY): Payer: Self-pay

## 2021-12-31 ENCOUNTER — Other Ambulatory Visit (HOSPITAL_COMMUNITY): Payer: Self-pay | Admitting: Internal Medicine

## 2021-12-31 DIAGNOSIS — R519 Headache, unspecified: Secondary | ICD-10-CM

## 2021-12-31 DIAGNOSIS — R51 Headache with orthostatic component, not elsewhere classified: Secondary | ICD-10-CM | POA: Diagnosis not present

## 2022-01-05 ENCOUNTER — Other Ambulatory Visit (HOSPITAL_COMMUNITY): Payer: Self-pay

## 2022-01-20 ENCOUNTER — Other Ambulatory Visit (HOSPITAL_COMMUNITY): Payer: Self-pay

## 2022-01-20 ENCOUNTER — Ambulatory Visit (HOSPITAL_COMMUNITY)
Admission: RE | Admit: 2022-01-20 | Discharge: 2022-01-20 | Disposition: A | Discharge status: Home / Self Care | Payer: 59 | Source: Ambulatory Visit | Attending: Internal Medicine | Admitting: Internal Medicine

## 2022-01-20 DIAGNOSIS — R519 Headache, unspecified: Secondary | ICD-10-CM | POA: Insufficient documentation

## 2022-01-20 MED ORDER — GADOBUTROL 1 MMOL/ML IV SOLN
8.0000 mL | Freq: Once | INTRAVENOUS | Status: AC | PRN
  Administered 2022-01-20: 8 mL via INTRAVENOUS

## 2022-01-26 ENCOUNTER — Other Ambulatory Visit (HOSPITAL_COMMUNITY): Payer: Self-pay

## 2022-02-02 ENCOUNTER — Encounter: Payer: Self-pay | Admitting: *Deleted

## 2022-02-02 NOTE — Patient Instructions (Addendum)
Referring MD/PCP: Asencion Noble  Procedure: Colonoscopy  Has patient had this procedure before?  Yes, 02/27/16, Dr. Gala Romney  If so, when, by whom and where?    Is there a family history of colon cancer?  no  Who?  What age when diagnosed?    Is patient diabetic? If yes, Type 1 or Type 2   no      Does patient have prosthetic heart valve or mechanical valve?  no  Do you have a pacemaker/defibrillator?  no  Has patient ever had endocarditis/atrial fibrillation? no  Does patient use oxygen? no  Has patient had joint replacement within last 12 months?  no  Is patient constipated or do they take laxatives? no  Does patient have a history of alcohol/drug use?  no  Have you had a stroke/heart attack last 6 mths? no  Do you take medicine for weight loss?  no   Is patient on blood thinner such as Coumadin, Plavix and/or Aspirin? no  Medications:   Current Outpatient Medications on File Prior to Visit  Medication Sig Dispense Refill   Apremilast (OTEZLA) 30 MG TABS Take 1 tablet (30 mg total) by mouth 2 (two) times daily with food. 180 tablet 3   rosuvastatin (CRESTOR) 20 MG tablet Take 1 tablet (20 mg total) by mouth daily. 90 tablet 4   No current facility-administered medications on file prior to visit.      Allergies:  Allergies  Allergen Reactions   Other     Hazelnut

## 2022-02-08 ENCOUNTER — Other Ambulatory Visit (HOSPITAL_COMMUNITY): Payer: Self-pay

## 2022-02-18 NOTE — Progress Notes (Signed)
Last colonoscopy July 2017: Normal exam.  5-year high risk screening recommended due to first-degree relative with advanced adenoma at age less than 95.  Records reviewed.  Appropriate for colonoscopy.  ASA 1.

## 2022-02-18 NOTE — Addendum Note (Signed)
Addended by: Mahala Menghini on: 02/18/2022 01:20 PM   Modules accepted: Orders

## 2022-02-19 NOTE — Progress Notes (Signed)
Spoke with Dr. Wolfgang Phoenix. I will call him back regarding potential date in august with Dr. Gala Romney

## 2022-03-01 ENCOUNTER — Other Ambulatory Visit (HOSPITAL_COMMUNITY): Payer: Self-pay

## 2022-03-02 ENCOUNTER — Encounter: Payer: Self-pay | Admitting: *Deleted

## 2022-03-02 ENCOUNTER — Other Ambulatory Visit (HOSPITAL_COMMUNITY): Payer: Self-pay

## 2022-03-02 MED ORDER — CLENPIQ 10-3.5-12 MG-GM -GM/175ML PO SOLN
1.0000 | ORAL | 0 refills | Status: DC
  Filled 2022-03-02: qty 350, fill #0
  Filled 2022-03-18: qty 350, 1d supply, fill #0

## 2022-03-02 NOTE — Progress Notes (Signed)
Called mobile, no answer and no able to leave VM

## 2022-03-02 NOTE — Progress Notes (Signed)
Spoke with pt. Scheduled for 8/25 at 8:30am. Aware will mail instructions and will send rx to pharmacy. [facility].

## 2022-03-03 ENCOUNTER — Other Ambulatory Visit (HOSPITAL_COMMUNITY): Payer: Self-pay

## 2022-03-04 NOTE — Progress Notes (Signed)
Pt came in office and needed to change date. He has been moved to 8/4 at 7:30am. Aware will send new instructions.

## 2022-03-15 ENCOUNTER — Other Ambulatory Visit (HOSPITAL_COMMUNITY): Payer: Self-pay

## 2022-03-18 ENCOUNTER — Other Ambulatory Visit (HOSPITAL_COMMUNITY): Payer: Self-pay

## 2022-03-22 ENCOUNTER — Telehealth: Payer: Self-pay | Admitting: Pharmacist

## 2022-03-22 NOTE — Telephone Encounter (Signed)
Called patient to schedule an appointment for the [employer] [facility]. I was unable to reach the patient so I left a HIPAA-compliant message requesting that the patient return my call.   Luke Van Ausdall, PharmD, BCACP, CPP Clinical Pharmacist [facility] [phone]  

## 2022-03-23 ENCOUNTER — Encounter (HOSPITAL_COMMUNITY)
Admission: RE | Admit: 2022-03-23 | Discharge: 2022-03-23 | Disposition: A | Discharge status: Home / Self Care | Payer: 59 | Source: Ambulatory Visit | Attending: Internal Medicine | Admitting: Internal Medicine

## 2022-03-26 ENCOUNTER — Encounter (HOSPITAL_COMMUNITY)
Admission: RE | Disposition: A | Discharge status: Home / Self Care | Payer: Self-pay | Source: Ambulatory Visit | Attending: Internal Medicine

## 2022-03-26 ENCOUNTER — Encounter (HOSPITAL_COMMUNITY): Payer: Self-pay | Admitting: Internal Medicine

## 2022-03-26 ENCOUNTER — Ambulatory Visit (HOSPITAL_COMMUNITY): Payer: 59 | Admitting: Anesthesiology

## 2022-03-26 ENCOUNTER — Ambulatory Visit (HOSPITAL_BASED_OUTPATIENT_CLINIC_OR_DEPARTMENT_OTHER): Payer: 59 | Admitting: Anesthesiology

## 2022-03-26 ENCOUNTER — Ambulatory Visit (HOSPITAL_COMMUNITY)
Admission: RE | Admit: 2022-03-26 | Discharge: 2022-03-26 | Disposition: A | Discharge status: Home / Self Care | Payer: 59 | Source: Ambulatory Visit | Attending: Internal Medicine | Admitting: Internal Medicine

## 2022-03-26 DIAGNOSIS — D124 Benign neoplasm of descending colon: Secondary | ICD-10-CM | POA: Diagnosis not present

## 2022-03-26 DIAGNOSIS — Z1211 Encounter for screening for malignant neoplasm of colon: Secondary | ICD-10-CM | POA: Diagnosis not present

## 2022-03-26 DIAGNOSIS — D122 Benign neoplasm of ascending colon: Secondary | ICD-10-CM | POA: Diagnosis not present

## 2022-03-26 DIAGNOSIS — K635 Polyp of colon: Secondary | ICD-10-CM | POA: Diagnosis not present

## 2022-03-26 DIAGNOSIS — Z8371 Family history of colonic polyps: Secondary | ICD-10-CM | POA: Insufficient documentation

## 2022-03-26 HISTORY — PX: POLYPECTOMY: SHX5525

## 2022-03-26 HISTORY — PX: COLONOSCOPY WITH PROPOFOL: SHX5780

## 2022-03-26 SURGERY — COLONOSCOPY WITH PROPOFOL
Anesthesia: General

## 2022-03-26 MED ORDER — LACTATED RINGERS IV SOLN
INTRAVENOUS | Status: DC
Start: 2022-03-26 — End: 2022-03-26
  Administered 2022-03-26: 1000 mL via INTRAVENOUS

## 2022-03-26 MED ORDER — ONDANSETRON HCL 4 MG/2ML IJ SOLN
INTRAMUSCULAR | Status: DC | PRN
  Administered 2022-03-26: 4 mg via INTRAVENOUS

## 2022-03-26 MED ORDER — LACTATED RINGERS IV SOLN: INTRAVENOUS | Status: DC | PRN

## 2022-03-26 MED ORDER — PROPOFOL 500 MG/50ML IV EMUL
INTRAVENOUS | Status: DC | PRN
  Administered 2022-03-26: 150 ug/kg/min via INTRAVENOUS

## 2022-03-26 MED ORDER — PROPOFOL 10 MG/ML IV BOLUS
INTRAVENOUS | Status: DC | PRN
  Administered 2022-03-26: 100 mg via INTRAVENOUS
  Administered 2022-03-26: 50 mg via INTRAVENOUS

## 2022-03-26 NOTE — Op Note (Signed)
[facility] Patient Name: Gary Fischer Procedure Date: 03/26/2022 6:54 AM MRN: [id number] Date of Birth: [birth year] Attending MD: Norvel Richards , MD CSN: [id number] Age: 61 Admit Type: Outpatient Procedure:                Colonoscopy Indications:              Colon cancer screening in patient with 1st-degree                            relative having advanced adenoma of the colon                            before age 46 Providers:                Norvel Richards, MD, Rosina Lowenstein, RN, Raphael Gibney, Technician Referring MD:              Medicines:                Propofol per Anesthesia Complications:            No immediate complications. Estimated Blood Loss:     Estimated blood loss was minimal. Procedure:                Pre-Anesthesia Assessment:                           - Prior to the procedure, a History and Physical                            was performed, and patient medications and                            allergies were reviewed. The patient's tolerance of                            previous anesthesia was also reviewed. The risks                            and benefits of the procedure and the sedation                            options and risks were discussed with the patient.                            All questions were answered, and informed consent                            was obtained. Prior Anticoagulants: The patient has                            taken no previous anticoagulant or antiplatelet  agents. ASA Grade Assessment: II - A patient with                            mild systemic disease. After reviewing the risks                            and benefits, the patient was deemed in                            satisfactory condition to undergo the procedure.                           After obtaining informed consent, the colonoscope                            was passed under direct vision.  Throughout the                            procedure, the patient's blood pressure, pulse, and                            oxygen saturations were monitored continuously. The                            [id number]) scope was introduced through the                            anus and advanced to the the cecum, identified by                            appendiceal orifice and ileocecal valve. The                            colonoscopy was performed without difficulty. The                            patient tolerated the procedure well. The quality                            of the bowel preparation was adequate. Scope In: 7:34:37 AM Scope Out: 7:48:27 AM Scope Withdrawal Time: 0 hours 10 minutes 48 seconds  Total Procedure Duration: 0 hours 13 minutes 50 seconds  Findings:      The perianal and digital rectal examinations were normal.      Two sessile polyps were found in the descending colon and ascending       colon. The polyps were 2 to 5 mm in size. These polyps were removed with       a cold snare. Resection and retrieval were complete. Estimated blood       loss was minimal. Estimated blood loss was minimal.      The exam was otherwise without abnormality on direct and retroflexion       views. Impression:               - Two 2 to 5 mm polyps in  the descending colon and                            in the ascending colon, removed with a cold snare.                            Resected and retrieved.                           - The examination was otherwise normal on direct                            and retroflexion views. Moderate Sedation:      Moderate (conscious) sedation was personally administered by an       anesthesia professional. The following parameters were monitored: oxygen       saturation, heart rate, blood pressure, respiratory rate, EKG, adequacy       of pulmonary ventilation, and response to care. Recommendation:           - Patient has a contact number available  for                            emergencies. The signs and symptoms of potential                            delayed complications were discussed with the                            patient. Return to normal activities tomorrow.                            Written discharge instructions were provided to the                            patient.                           - Resume previous diet.                           - Continue present medications.                           - Repeat colonoscopy date to be determined after                            pending pathology results are reviewed for                            surveillance based on pathology results.                           - Return to GI office (date not yet determined). Procedure Code(s):        --- Professional ---  45385, Colonoscopy, flexible; with removal of                            tumor(s), polyp(s), or other lesion(s) by snare                            technique Diagnosis Code(s):        --- Professional ---                           Z83.71, Family history of colonic polyps                           K63.5, Polyp of colon CPT copyright 2019 American Medical Association. All rights reserved. The codes documented in this report are preliminary and upon coder review may  be revised to meet current compliance requirements. Cristopher Estimable. Szymon Foiles, MD Norvel Richards, MD 03/26/2022 7:56:45 AM This report has been signed electronically. Number of Addenda: 0

## 2022-03-26 NOTE — Discharge Instructions (Addendum)
  Colonoscopy Discharge Instructions  Read the instructions outlined below and refer to this sheet in the next few weeks. These discharge instructions provide you with general information on caring for yourself after you leave the hospital. Your doctor may also give you specific instructions. While your treatment has been planned according to the most current medical practices available, unavoidable complications occasionally occur. If you have any problems or questions after discharge, call Dr. Gala Romney at [phone]. ACTIVITY You may resume your regular activity, but move at a slower pace for the next 24 hours.  Take frequent rest periods for the next 24 hours.  Walking will help get rid of the air and reduce the bloated feeling in your belly (abdomen).  No driving for 24 hours (because of the medicine (anesthesia) used during the test).   Do not sign any important legal documents or operate any machinery for 24 hours (because of the anesthesia used during the test).  NUTRITION Drink plenty of fluids.  You may resume your normal diet as instructed by your doctor.  Begin with a light meal and progress to your normal diet. Heavy or fried foods are harder to digest and may make you feel sick to your stomach (nauseated).  Avoid alcoholic beverages for 24 hours or as instructed.  MEDICATIONS You may resume your normal medications unless your doctor tells you otherwise.  WHAT YOU CAN EXPECT TODAY Some feelings of bloating in the abdomen.  Passage of more gas than usual.  Spotting of blood in your stool or on the toilet paper.  IF YOU HAD POLYPS REMOVED DURING THE COLONOSCOPY: No aspirin products for 7 days or as instructed.  No alcohol for 7 days or as instructed.  Eat a soft diet for the next 24 hours.  FINDING OUT THE RESULTS OF YOUR TEST Not all test results are available during your visit. If your test results are not back during the visit, make an appointment with your caregiver to find out the  results. Do not assume everything is normal if you have not heard from your caregiver or the medical facility. It is important for you to follow up on all of your test results.  SEEK IMMEDIATE MEDICAL ATTENTION IF: You have more than a spotting of blood in your stool.  Your belly is swollen (abdominal distention).  You are nauseated or vomiting.  You have a temperature over 101.  You have abdominal pain or discomfort that is severe or gets worse throughout the day.    2 small polyps removed from your colon today; otherwise, normal examination  Further recommendations to follow pending review of pathology report  At your request, I called Kathlee Nations at [phone] -reviewed findings and recommendations

## 2022-03-26 NOTE — H&P (Signed)
_0 @   Primary Care Physician:  Asencion Noble, MD Primary Gastroenterologist:  Dr. Gala Romney  Pre-Procedure History & Physical: HPI:  Gary Fischer is a 61 y.o. male is here for a screening colonoscopy.  Family history of first-degree relative with advanced adenoma at a young age.  Negative colonoscopy 2017.  Past Medical History:  Diagnosis Date   Psoriasis     Past Surgical History:  Procedure Laterality Date   COLONOSCOPY N/A 02/27/2016   Procedure: COLONOSCOPY;  Surgeon: Daneil Dolin, MD;  [facility]: [facility];  Service: Endoscopy;  Laterality: N/A;  7:30 Am   NOSE SURGERY     devated septum    Prior to Admission medications   Medication Sig Start Date End Date Taking? Authorizing Provider  Apremilast (OTEZLA) 30 MG TABS Take 1 tablet (30 mg total) by mouth 2 (two) times daily with food. 04/06/21  Yes Tresa Garter, MD  rosuvastatin (CRESTOR) 20 MG tablet Take 1 tablet (20 mg total) by mouth daily. 01/22/21  Yes   Sod Picosulfate-Mag Ox-Cit Acd (CLENPIQ) 10-3.5-12 MG-GM -GM/175ML SOLN Take 1 kit by mouth as directed. 03/02/22  Yes Berda Shelvin, Cristopher Estimable, MD    Allergies as of 03/02/2022 - Review Complete 09/17/2021  Allergen Reaction Noted   Other  05/03/2011    Family History  Problem Relation Age of Onset   Hyperlipidemia Mother    Alzheimer's disease Mother    Hyperlipidemia Father     Social History   Socioeconomic History   Marital status: Married    Spouse name: Not on file   Number of children: Not on file   Years of education: Not on file   Highest education level: Not on file  Occupational History   Not on file  Tobacco Use   Smoking status: Never   Smokeless tobacco: Never  Substance and Sexual Activity   Alcohol use: No    Alcohol/week: 0.0 standard drinks of alcohol   Drug use: No   Sexual activity: Not on file  Other Topics Concern   Not on file  Social History Narrative   Not on file   Social Determinants of Health   Financial Resource  Strain: Not on file  Food Insecurity: Not on file  Transportation Needs: Not on file  Physical Activity: Not on file  Stress: Not on file  Social Connections: Not on file  Intimate Partner Violence: Not on file    Review of Systems: See HPI, otherwise negative ROS  Physical Exam: BP 136/74   Pulse (!) 48   Temp 97.8 F (36.6 C) (Oral)   Resp 16   Ht _1  (1.854 m)   Wt 77.1 kg   SpO2 95%   BMI 22.43 kg/m  General:   Alert,  Well-developed, well-nourished, pleasant and cooperative in NAD Head:  Normocephalic and atraumatic. Lungs:  Clear throughout to auscultation.   No wheezes, crackles, or rhonchi. No acute distress. Heart:  Regular rate and rhythm; no murmurs, clicks, rubs,  or gallops. Abdomen:  Soft, nontender and nondistended. No masses, hepatosplenomegaly or hernias noted. Normal bowel sounds, without guarding, and without rebound.    Impression/Plan: Gary Fischer is now here to undergo a screening colonoscopy.  High risk screening examination.  Risks, benefits, limitations, imponderables and alternatives regarding colonoscopy have been reviewed with the patient. Questions have been answered. All parties agreeable.     Notice:  This dictation was prepared with Dragon dictation along with smaller phrase technology. Any transcriptional errors that  result from this process are unintentional and may not be corrected upon review.

## 2022-03-26 NOTE — Anesthesia Postprocedure Evaluation (Signed)
Anesthesia Post Note  Patient: Gary Fischer  Procedure(s) Performed: COLONOSCOPY WITH PROPOFOL POLYPECTOMY  Patient location during evaluation: Phase II Anesthesia Type: General Level of consciousness: awake Pain management: pain level controlled Vital Signs Assessment: post-procedure vital signs reviewed and stable Respiratory status: spontaneous breathing and respiratory function stable Cardiovascular status: blood pressure returned to baseline and stable Postop Assessment: no headache and no apparent nausea or vomiting Anesthetic complications: no Comments: Late entry   No notable events documented.   Last Vitals:  Vitals:   03/26/22 0750 03/26/22 0754  BP: (!) 91/40 99/62  Pulse: (!) 50   Resp: 16 14  Temp: 36.7 C   SpO2: 95%     Last Pain:  Vitals:   03/26/22 0750  TempSrc: Oral  PainSc: [name]

## 2022-03-26 NOTE — Anesthesia Preprocedure Evaluation (Signed)
Anesthesia Evaluation  Patient identified by MRN, date of birth, ID band Patient awake    Reviewed: Allergy & Precautions, H&P , NPO status , Patient's Chart, lab work & pertinent test results, reviewed documented beta blocker date and time   Airway Mallampati: II  TM Distance: >3 FB Neck ROM: full    Dental no notable dental hx.    Pulmonary neg pulmonary ROS,    Pulmonary exam normal breath sounds clear to auscultation       Cardiovascular Exercise Tolerance: Good negative cardio ROS   Rhythm:regular Rate:Normal     Neuro/Psych negative neurological ROS  negative psych ROS   GI/Hepatic negative GI ROS, Neg liver ROS,   Endo/Other  negative endocrine ROS  Renal/GU negative Renal ROS  negative genitourinary   Musculoskeletal   Abdominal   Peds  Hematology negative hematology ROS (+)   Anesthesia Other Findings   Reproductive/Obstetrics negative OB ROS                             Anesthesia Physical Anesthesia Plan  ASA: 1  Anesthesia Plan: General   Post-op Pain Management:    Induction:   PONV Risk Score and Plan: Propofol infusion  Airway Management Planned:   Additional Equipment:   Intra-op Plan:   Post-operative Plan:   Informed Consent: I have reviewed the patients History and Physical, chart, labs and discussed the procedure including the risks, benefits and alternatives for the proposed anesthesia with the patient or authorized representative who has indicated his/her understanding and acceptance.     Dental Advisory Given  Plan Discussed with: CRNA  Anesthesia Plan Comments:         Anesthesia Quick Evaluation

## 2022-03-26 NOTE — Transfer of Care (Signed)
Immediate Anesthesia Transfer of Care Note  Patient: Gary Fischer  Procedure(s) Performed: COLONOSCOPY WITH PROPOFOL POLYPECTOMY  Patient Location: Short Stay  Anesthesia Type:General  Level of Consciousness: sedated  Airway & Oxygen Therapy: Patient Spontanous Breathing  Post-op Assessment: Report given to RN and Post -op Vital signs reviewed and stable  Post vital signs: Reviewed and stable  Last Vitals:  Vitals Value Taken Time  BP 91/40 03/26/22 0750  Temp 36.7 C 03/26/22 0750  Pulse 50 03/26/22 0750  Resp 16 03/26/22 0750  SpO2 95 % 03/26/22 0750    Last Pain:  Vitals:   03/26/22 0750  TempSrc: Oral  PainSc: Asleep      Patients Stated Pain Goal: 8 (35/24/81 8590)  Complications: No notable events documented.

## 2022-03-29 ENCOUNTER — Encounter: Payer: Self-pay | Admitting: Internal Medicine

## 2022-03-29 LAB — SURGICAL PATHOLOGY

## 2022-04-01 ENCOUNTER — Encounter (HOSPITAL_COMMUNITY): Payer: Self-pay | Admitting: Internal Medicine

## 2022-04-02 ENCOUNTER — Other Ambulatory Visit (HOSPITAL_COMMUNITY): Payer: Self-pay

## 2022-04-05 ENCOUNTER — Other Ambulatory Visit (HOSPITAL_COMMUNITY): Payer: Self-pay

## 2022-04-05 MED ORDER — OTEZLA 30 MG PO TABS
30.0000 mg | ORAL_TABLET | Freq: Two times a day (BID) | ORAL | 3 refills | Status: DC
  Filled 2022-04-05: qty 180, 90d supply, fill #0

## 2022-04-06 ENCOUNTER — Other Ambulatory Visit (HOSPITAL_COMMUNITY): Payer: Self-pay

## 2022-04-08 ENCOUNTER — Ambulatory Visit: Payer: 59 | Attending: Internal Medicine | Admitting: Pharmacist

## 2022-04-08 ENCOUNTER — Telehealth: Payer: Self-pay | Admitting: Pharmacist

## 2022-04-08 ENCOUNTER — Other Ambulatory Visit (HOSPITAL_COMMUNITY): Payer: Self-pay

## 2022-04-08 DIAGNOSIS — Z79899 Other long term (current) drug therapy: Secondary | ICD-10-CM

## 2022-04-08 MED ORDER — OTEZLA 30 MG PO TABS
30.0000 mg | ORAL_TABLET | Freq: Two times a day (BID) | ORAL | 3 refills | Status: DC
  Filled 2022-04-08: qty 60, 30d supply, fill #0
  Filled 2022-04-30: qty 60, 30d supply, fill #1
  Filled 2022-06-09: qty 60, 30d supply, fill #2
  Filled 2022-07-18: qty 60, 30d supply, fill #3
  Filled 2022-08-09: qty 60, 30d supply, fill #4
  Filled 2022-09-02: qty 60, 30d supply, fill #5
  Filled 2022-09-28: qty 60, 30d supply, fill #6
  Filled 2022-10-26: qty 60, 30d supply, fill #7
  Filled 2022-12-02: qty 60, 30d supply, fill #8
  Filled 2023-01-03: qty 60, 30d supply, fill #9
  Filled 2023-02-08: qty 60, 30d supply, fill #10
  Filled 2023-03-08: qty 60, 30d supply, fill #11

## 2022-04-08 NOTE — Progress Notes (Signed)
S: Patient presents for review of his medication.  Patient is currently taking Otezla for psoriasis. Patient is managed by Dr. Rozann Lesches for this.   Adherence: denies any recent missed doses. Reports occasional missed evening dose but is doing better with this.   Efficacy: reports decreased plaque thickness and itching with [place]. Still has some scaling of skin but is happy with current control.   Monitoring: GI upset: nausea occasionally but not dictating therapy at this time. Neuropsychiatric changes: denies Weight: stable  Labs: WNL and monitored regularly  O:  Lab Results  Component Value Date   WBC 3.3 (L) 01/30/2013   HGB 14.7 01/30/2013   HCT 40.9 01/30/2013   MCV 88.3 01/30/2013   PLT 236 01/30/2013      Chemistry      Component Value Date/Time   NA 136 05/13/2008 1215   K 4.2 05/13/2008 1215   CL 99 05/13/2008 1215   CO2 31 05/13/2008 1215   BUN 9 05/13/2008 1215   CREATININE 0.93 05/13/2008 1215      Component Value Date/Time   CALCIUM 9.2 05/13/2008 1215   ALKPHOS 62 05/13/2008 1215   AST 22 05/13/2008 1215   ALT 19 05/13/2008 1215   BILITOT 0.9 05/13/2008 1215      A/P: 1. Medication review: patient on Otezla and tolerating it well with good control of psoriasis. Denies any side effects. No recommendations for any changes at this time.  Benard Halsted, PharmD, [facility] [place], [place] [phone]

## 2022-04-08 NOTE — Telephone Encounter (Signed)
Called patient to schedule an appointment for the [employer] [facility]. I was unable to reach the patient so I left a HIPAA-compliant message requesting that the patient return my call.   Luke Van Ausdall, PharmD, BCACP, CPP Clinical Pharmacist [facility] [phone]  

## 2022-04-14 ENCOUNTER — Other Ambulatory Visit (HOSPITAL_COMMUNITY): Payer: 59

## 2022-04-16 ENCOUNTER — Ambulatory Visit (INDEPENDENT_AMBULATORY_CARE_PROVIDER_SITE_OTHER): Payer: 59 | Admitting: Nurse Practitioner

## 2022-04-16 VITALS — BP 123/76 | Temp 98.1°F | Ht 73.0 in | Wt 174.4 lb

## 2022-04-16 DIAGNOSIS — Z0000 Encounter for general adult medical examination without abnormal findings: Secondary | ICD-10-CM | POA: Diagnosis not present

## 2022-04-16 NOTE — Progress Notes (Unsigned)
   Subjective:    Patient ID: Gary Fischer, male    DOB: Nov 21, 1960, 61 y.o.   MRN: [id number]  HPI The patient comes in today for a wellness visit.    A review of their health history was completed.  A review of medications was also completed.  Any needed refills;   Eating habits: overall healthy, well balanced  Falls/  MVA accidents in past few months: no  Regular exercise: 3 to 4 times per week   Specialist pt sees on regular basis: cardiologist  Preventative health issues were discussed.   Additional concerns: None Married, same sexual partner. Regular dental and vision exams.     04/16/2022    3:28 PM  Depression screen PHQ 2/9  Decreased Interest 0  Down, Depressed, Hopeless 0  PHQ - 2 Score 0      Review of Systems  Constitutional:  Negative for activity change, appetite change and fatigue.  HENT:  Negative for dental problem, sore throat and trouble swallowing.   Respiratory:  Negative for cough, chest tightness, shortness of breath and wheezing.   Cardiovascular:  Negative for chest pain.  Gastrointestinal:  Negative for abdominal distention, abdominal pain, constipation, diarrhea, nausea and vomiting.  Genitourinary:  Negative for difficulty urinating, dysuria, frequency, genital sores, penile discharge, penile pain, penile swelling, scrotal swelling, testicular pain and urgency.  Psychiatric/Behavioral:  Negative for sleep disturbance.        Objective:   Physical Exam Vitals and nursing note reviewed.  Constitutional:      General: He is not in acute distress.    Appearance: He is well-developed.  Neck:     Thyroid: No thyromegaly.     Trachea: No tracheal deviation.     Comments: Thyroid non tender to palpation, no mass or goiter noted.  Cardiovascular:     Rate and Rhythm: Normal rate and regular rhythm.     Heart sounds: Normal heart sounds.  Pulmonary:     Effort: Pulmonary effort is normal.     Breath sounds: Normal breath sounds.   Abdominal:     General: There is no distension.     Palpations: Abdomen is soft.     Tenderness: There is no abdominal tenderness.  Genitourinary:    Comments: Defers GU exam. Denies an problems.  Musculoskeletal:     Cervical back: Normal range of motion and neck supple.  Lymphadenopathy:     Cervical: No cervical adenopathy.  Skin:    General: Skin is warm and dry.  Neurological:     Mental Status: He is alert and oriented to person, place, and time.     Deep Tendon Reflexes: Reflexes are normal and symmetric.  Psychiatric:        Mood and Affect: Mood normal.        Behavior: Behavior normal.        Thought Content: Thought content normal.        Judgment: Judgment normal.    Today's Vitals   04/16/22 1457  BP: 123/76  Temp: 98.1 F (36.7 C)  Weight: 174 lb 6.4 oz (79.1 kg)  Height: '6\' 1"'$  (1.854 m)   Body mass index is 23.01 kg/m.  See scanned labs 12/08/21.       Assessment & Plan:  Annual physical exam  Continue routine follow up with Dr. Willey Blade for chronic health issues.  Recommend flu vaccine this fall.  Return in about 1 year (around 04/17/2023) for physical.

## 2022-04-17 ENCOUNTER — Encounter: Payer: Self-pay | Admitting: Nurse Practitioner

## 2022-04-19 ENCOUNTER — Other Ambulatory Visit (HOSPITAL_COMMUNITY): Payer: Self-pay

## 2022-04-20 ENCOUNTER — Other Ambulatory Visit (HOSPITAL_COMMUNITY): Payer: Self-pay

## 2022-04-20 MED ORDER — ROSUVASTATIN CALCIUM 20 MG PO TABS
20.0000 mg | ORAL_TABLET | Freq: Every day | ORAL | 4 refills | Status: DC
  Filled 2022-04-20: qty 90, 90d supply, fill #0
  Filled 2022-07-18: qty 90, 90d supply, fill #1
  Filled 2022-10-16: qty 90, 90d supply, fill #2

## 2022-04-21 ENCOUNTER — Encounter: Payer: Self-pay | Admitting: Nurse Practitioner

## 2022-04-30 ENCOUNTER — Other Ambulatory Visit (HOSPITAL_COMMUNITY): Payer: Self-pay

## 2022-05-07 ENCOUNTER — Other Ambulatory Visit (HOSPITAL_COMMUNITY): Payer: Self-pay

## 2022-05-31 ENCOUNTER — Other Ambulatory Visit (HOSPITAL_COMMUNITY): Payer: Self-pay

## 2022-06-02 ENCOUNTER — Other Ambulatory Visit (HOSPITAL_COMMUNITY): Payer: Self-pay

## 2022-06-04 ENCOUNTER — Other Ambulatory Visit (HOSPITAL_COMMUNITY): Payer: Self-pay

## 2022-06-08 ENCOUNTER — Other Ambulatory Visit (HOSPITAL_COMMUNITY): Payer: Self-pay

## 2022-06-09 ENCOUNTER — Other Ambulatory Visit (HOSPITAL_COMMUNITY): Payer: Self-pay

## 2022-06-10 ENCOUNTER — Other Ambulatory Visit (HOSPITAL_COMMUNITY): Payer: Self-pay

## 2022-06-30 ENCOUNTER — Other Ambulatory Visit (HOSPITAL_COMMUNITY): Payer: Self-pay

## 2022-07-02 ENCOUNTER — Other Ambulatory Visit (HOSPITAL_COMMUNITY): Payer: Self-pay

## 2022-07-05 ENCOUNTER — Other Ambulatory Visit (HOSPITAL_COMMUNITY): Payer: Self-pay

## 2022-07-07 DIAGNOSIS — L400 Psoriasis vulgaris: Secondary | ICD-10-CM | POA: Diagnosis not present

## 2022-07-07 DIAGNOSIS — D225 Melanocytic nevi of trunk: Secondary | ICD-10-CM | POA: Diagnosis not present

## 2022-07-07 DIAGNOSIS — Z1283 Encounter for screening for malignant neoplasm of skin: Secondary | ICD-10-CM | POA: Diagnosis not present

## 2022-07-18 ENCOUNTER — Other Ambulatory Visit (HOSPITAL_COMMUNITY): Payer: Self-pay

## 2022-07-19 ENCOUNTER — Other Ambulatory Visit (HOSPITAL_COMMUNITY): Payer: Self-pay

## 2022-07-29 DIAGNOSIS — Z125 Encounter for screening for malignant neoplasm of prostate: Secondary | ICD-10-CM | POA: Diagnosis not present

## 2022-07-29 DIAGNOSIS — E785 Hyperlipidemia, unspecified: Secondary | ICD-10-CM | POA: Diagnosis not present

## 2022-07-29 DIAGNOSIS — Z79899 Other long term (current) drug therapy: Secondary | ICD-10-CM | POA: Diagnosis not present

## 2022-07-29 DIAGNOSIS — I2584 Coronary atherosclerosis due to calcified coronary lesion: Secondary | ICD-10-CM | POA: Diagnosis not present

## 2022-08-05 ENCOUNTER — Encounter (HOSPITAL_COMMUNITY): Payer: Self-pay

## 2022-08-05 ENCOUNTER — Other Ambulatory Visit: Payer: Self-pay

## 2022-08-05 ENCOUNTER — Other Ambulatory Visit (HOSPITAL_COMMUNITY): Payer: Self-pay

## 2022-08-05 DIAGNOSIS — R931 Abnormal findings on diagnostic imaging of heart and coronary circulation: Secondary | ICD-10-CM | POA: Diagnosis not present

## 2022-08-05 DIAGNOSIS — E785 Hyperlipidemia, unspecified: Secondary | ICD-10-CM | POA: Diagnosis not present

## 2022-08-05 MED ORDER — SILDENAFIL CITRATE 50 MG PO TABS
50.0000 mg | ORAL_TABLET | Freq: Every day | ORAL | 12 refills | Status: DC | PRN
  Filled 2022-08-05: qty 30, 30d supply, fill #0
  Filled 2023-05-02: qty 30, 30d supply, fill #1
  Filled 2023-07-10: qty 30, 30d supply, fill #2

## 2022-08-06 ENCOUNTER — Other Ambulatory Visit (HOSPITAL_COMMUNITY): Payer: Self-pay

## 2022-08-06 ENCOUNTER — Other Ambulatory Visit: Payer: Self-pay

## 2022-08-07 ENCOUNTER — Other Ambulatory Visit (HOSPITAL_COMMUNITY): Payer: Self-pay

## 2022-08-07 MED ORDER — TRIAMCINOLONE ACETONIDE 0.1 % EX CREA
TOPICAL_CREAM | CUTANEOUS | 3 refills | Status: DC
  Filled 2022-08-07: qty 454, 30d supply, fill #0
  Filled 2023-05-02: qty 454, 30d supply, fill #1

## 2022-08-09 ENCOUNTER — Other Ambulatory Visit (HOSPITAL_COMMUNITY): Payer: Self-pay

## 2022-08-09 ENCOUNTER — Other Ambulatory Visit: Payer: Self-pay

## 2022-08-09 MED ORDER — TAMSULOSIN HCL 0.4 MG PO CAPS
0.4000 mg | ORAL_CAPSULE | Freq: Every day | ORAL | 4 refills | Status: DC
  Filled 2022-08-09: qty 90, 90d supply, fill #0
  Filled 2023-01-21 – 2023-01-29 (×2): qty 90, 90d supply, fill #1
  Filled 2023-05-02: qty 90, 90d supply, fill #2
  Filled 2023-08-01: qty 90, 90d supply, fill #3

## 2022-08-11 ENCOUNTER — Other Ambulatory Visit: Payer: Self-pay

## 2022-08-31 ENCOUNTER — Other Ambulatory Visit (HOSPITAL_COMMUNITY): Payer: Self-pay

## 2022-09-02 ENCOUNTER — Other Ambulatory Visit (HOSPITAL_COMMUNITY): Payer: Self-pay

## 2022-09-06 ENCOUNTER — Other Ambulatory Visit (HOSPITAL_COMMUNITY): Payer: Self-pay

## 2022-09-09 ENCOUNTER — Other Ambulatory Visit (HOSPITAL_COMMUNITY): Payer: Self-pay

## 2022-09-13 ENCOUNTER — Other Ambulatory Visit (HOSPITAL_COMMUNITY): Payer: Self-pay

## 2022-09-28 ENCOUNTER — Other Ambulatory Visit (HOSPITAL_COMMUNITY): Payer: Self-pay

## 2022-09-30 ENCOUNTER — Other Ambulatory Visit: Payer: Self-pay

## 2022-10-01 ENCOUNTER — Other Ambulatory Visit (HOSPITAL_COMMUNITY): Payer: Self-pay

## 2022-10-18 ENCOUNTER — Other Ambulatory Visit: Payer: Self-pay

## 2022-10-26 ENCOUNTER — Other Ambulatory Visit: Payer: Self-pay

## 2022-10-26 ENCOUNTER — Other Ambulatory Visit (HOSPITAL_COMMUNITY): Payer: Self-pay

## 2022-11-05 ENCOUNTER — Other Ambulatory Visit: Payer: Self-pay

## 2022-12-02 ENCOUNTER — Other Ambulatory Visit (HOSPITAL_COMMUNITY): Payer: Self-pay

## 2022-12-10 ENCOUNTER — Other Ambulatory Visit: Payer: Self-pay

## 2023-01-03 ENCOUNTER — Other Ambulatory Visit: Payer: Self-pay

## 2023-01-05 ENCOUNTER — Other Ambulatory Visit (HOSPITAL_COMMUNITY): Payer: Self-pay

## 2023-01-06 ENCOUNTER — Other Ambulatory Visit (HOSPITAL_COMMUNITY): Payer: Self-pay

## 2023-01-12 ENCOUNTER — Other Ambulatory Visit: Payer: Self-pay

## 2023-01-13 ENCOUNTER — Other Ambulatory Visit (HOSPITAL_COMMUNITY): Payer: Self-pay

## 2023-01-13 MED ORDER — ROSUVASTATIN CALCIUM 40 MG PO TABS
40.0000 mg | ORAL_TABLET | Freq: Every day | ORAL | 1 refills | Status: DC
  Filled 2023-01-13: qty 90, 90d supply, fill #0
  Filled 2023-04-11: qty 90, 90d supply, fill #1

## 2023-01-14 ENCOUNTER — Other Ambulatory Visit (HOSPITAL_COMMUNITY): Payer: Self-pay

## 2023-01-22 ENCOUNTER — Other Ambulatory Visit (HOSPITAL_COMMUNITY): Payer: Self-pay

## 2023-01-24 ENCOUNTER — Encounter: Payer: Self-pay | Admitting: Pharmacist

## 2023-01-24 ENCOUNTER — Other Ambulatory Visit: Payer: Self-pay

## 2023-01-27 ENCOUNTER — Other Ambulatory Visit: Payer: Self-pay

## 2023-01-29 ENCOUNTER — Other Ambulatory Visit (HOSPITAL_COMMUNITY): Payer: Self-pay

## 2023-02-08 ENCOUNTER — Other Ambulatory Visit (HOSPITAL_COMMUNITY): Payer: Self-pay

## 2023-03-02 DIAGNOSIS — Z135 Encounter for screening for eye and ear disorders: Secondary | ICD-10-CM | POA: Diagnosis not present

## 2023-03-02 DIAGNOSIS — H524 Presbyopia: Secondary | ICD-10-CM | POA: Diagnosis not present

## 2023-03-02 DIAGNOSIS — H5213 Myopia, bilateral: Secondary | ICD-10-CM | POA: Diagnosis not present

## 2023-03-02 DIAGNOSIS — H52223 Regular astigmatism, bilateral: Secondary | ICD-10-CM | POA: Diagnosis not present

## 2023-03-08 ENCOUNTER — Other Ambulatory Visit (HOSPITAL_COMMUNITY): Payer: Self-pay

## 2023-03-10 ENCOUNTER — Other Ambulatory Visit (HOSPITAL_COMMUNITY): Payer: Self-pay

## 2023-03-11 ENCOUNTER — Other Ambulatory Visit (HOSPITAL_COMMUNITY): Payer: Self-pay

## 2023-03-17 DIAGNOSIS — L720 Epidermal cyst: Secondary | ICD-10-CM | POA: Diagnosis not present

## 2023-03-30 ENCOUNTER — Ambulatory Visit: Payer: Commercial Managed Care - PPO | Admitting: Pharmacist

## 2023-03-30 DIAGNOSIS — Z79899 Other long term (current) drug therapy: Secondary | ICD-10-CM

## 2023-03-30 NOTE — Progress Notes (Signed)
S: Patient presents for review of his medication.  Patient is currently taking Otezla for psoriasis. Patient is managed by Dr. Charlton Haws for this.   Adherence: denies any recent missed doses. Uses a pill organizer to aid adherence.  Efficacy: reports decreased plaque thickness, itching, and scaling with Otezla. Still has some redness of skin but is happy with current control.   Monitoring: GI upset: nausea occasionally but not dictating therapy at this time. Helps to take with food. Neuropsychiatric changes: denies Weight: stable  Labs: WNL and monitored regularly  O:  Lab Results  Component Value Date   WBC 3.3 (L) 01/30/2013   HGB 14.7 01/30/2013   HCT 40.9 01/30/2013   MCV 88.3 01/30/2013   PLT 236 01/30/2013      Chemistry      Component Value Date/Time   NA 136 05/13/2008 1215   K 4.2 05/13/2008 1215   CL 99 05/13/2008 1215   CO2 31 05/13/2008 1215   BUN 9 05/13/2008 1215   CREATININE 0.93 05/13/2008 1215      Component Value Date/Time   CALCIUM 9.2 05/13/2008 1215   ALKPHOS 62 05/13/2008 1215   AST 22 05/13/2008 1215   ALT 19 05/13/2008 1215   BILITOT 0.9 05/13/2008 1215      A/P: 1. Medication review: patient on Otezla and tolerating it well with decent control of psoriasis. Denies any side effects. No recommendations for any changes at this time.  Butch Penny, PharmD, [place], CPP Clinical Pharmacist [facility] [phone]

## 2023-04-05 ENCOUNTER — Other Ambulatory Visit (HOSPITAL_COMMUNITY): Payer: Self-pay

## 2023-04-08 ENCOUNTER — Other Ambulatory Visit (HOSPITAL_COMMUNITY): Payer: Self-pay

## 2023-04-11 ENCOUNTER — Other Ambulatory Visit (HOSPITAL_COMMUNITY): Payer: Self-pay

## 2023-04-12 ENCOUNTER — Other Ambulatory Visit: Payer: Self-pay

## 2023-04-13 ENCOUNTER — Other Ambulatory Visit: Payer: Self-pay

## 2023-04-13 ENCOUNTER — Other Ambulatory Visit: Payer: Self-pay | Admitting: Pharmacist

## 2023-04-13 ENCOUNTER — Other Ambulatory Visit (HOSPITAL_COMMUNITY): Payer: Self-pay

## 2023-04-13 MED ORDER — APREMILAST 30 MG PO TABS
ORAL_TABLET | ORAL | 3 refills | Status: DC
  Filled 2023-04-13: qty 60, 30d supply, fill #0
  Filled 2023-05-03: qty 60, 30d supply, fill #1
  Filled 2023-06-09: qty 60, 30d supply, fill #2
  Filled 2023-07-11: qty 60, 30d supply, fill #3
  Filled 2023-08-05: qty 60, 30d supply, fill #4
  Filled 2023-09-02: qty 60, 30d supply, fill #5
  Filled 2023-09-29: qty 60, 30d supply, fill #6
  Filled 2023-11-03 (×2): qty 60, 30d supply, fill #7
  Filled 2023-11-29: qty 60, 30d supply, fill #8
  Filled 2023-12-29 – 2024-01-09 (×3): qty 60, 30d supply, fill #9
  Filled 2024-01-31: qty 60, 30d supply, fill #10
  Filled 2024-02-27 – 2024-02-29 (×2): qty 60, 30d supply, fill #11

## 2023-04-13 MED ORDER — APREMILAST 30 MG PO TABS: ORAL_TABLET | ORAL | 3 refills | Status: DC

## 2023-04-13 NOTE — Progress Notes (Signed)
Gary Fischer [address], [place] [address]   Clinic Day:  04/13/2023  Referring physician: [name], MD  Patient Care Team: Gary Perches, MD as PCP - General (Internal Medicine) Gary Rotunda, MD as PCP - Cardiology (Cardiology) Gary Gauss Gerrit Friends, MD (Gastroenterology)   ASSESSMENT & PLAN:   Assessment: ***  Plan: ***  No orders of the defined types were placed in this encounter.     Gary Fischer,acting as a Neurosurgeon for Gary Massed, MD.,have documented all relevant documentation on the behalf of Gary Massed, MD,as directed by  Gary Massed, MD while in the presence of Gary Massed, MD.   ***  Gary Fischer   8/21/20248:07 PM  CHIEF COMPLAINT/PURPOSE OF CONSULT:   Diagnosis: ***  Cancer Staging  No matching staging information was found for the patient.    Prior Therapy: ***  Current Therapy:  ***   HISTORY OF PRESENT ILLNESS:   Oncology History   No history exists.      Gary Fischer is a 62 y.o. male presenting to clinic today for evaluation of enlarged left axillary lymph node at the request of Gary Perches, MD.  Patient recently went to his PCP, Gary Fischer, on 03/17/23 when he developed mild discomfort in the area of his left axillary lymph node. He was referred to me after this visit.   Today, he states that he is doing well overall. His appetite level is at ***%. His energy level is at ***%.  PAST MEDICAL HISTORY:   Past Medical History: Past Medical History:  Diagnosis Date   Psoriasis     Surgical History: Past Surgical History:  Procedure Laterality Date   COLONOSCOPY N/A 02/27/2016   Procedure: COLONOSCOPY;  Surgeon: Gary Ade, MD;  [facility];  Service: Endoscopy;  Laterality: N/A;  7:30 Am   COLONOSCOPY WITH PROPOFOL N/A 03/26/2022   Procedure: COLONOSCOPY WITH PROPOFOL;  Surgeon: Gary Ade, MD;  Location: [facility];  Service: Endoscopy;  Laterality: N/A;   8:30am, asa 2   NOSE SURGERY     devated septum   POLYPECTOMY  03/26/2022   Procedure: POLYPECTOMY;  Surgeon: Gary Ade, MD;  Location: [facility];  Service: Endoscopy;;    Social History: Social History   Socioeconomic History   Marital status: Married    Spouse name: Not on file   Number of children: Not on file   Years of education: Not on file   Highest education level: Not on file  Occupational History   Not on file  Tobacco Use   Smoking status: Never   Smokeless tobacco: Never  Vaping Use   Vaping status: Never Used  Substance and Sexual Activity   Alcohol use: No    Alcohol/week: 0.0 standard drinks of alcohol   Drug use: No   Sexual activity: Yes  Other Topics Concern   Not on file  Social History Narrative   Not on file   Social Determinants of Health   Financial Resource Strain: Not on file  Food Insecurity: Not on file  Transportation Needs: Not on file  Physical Activity: Not on file  Stress: Not on file  Social Connections: Not on file  Intimate Partner Violence: Not on file    Family History: Family History  Problem Relation Age of Onset   Hyperlipidemia Mother    Alzheimer's disease Mother    Hyperlipidemia Father     Current Medications:  Current Outpatient Medications:  Apremilast 30 MG TABS, Take 1 tablet (30 mg total) by mouth 2 (two) times daily with food, Disp: 180 tablet, Rfl: 3   rosuvastatin (CRESTOR) 40 MG tablet, Take 1 tablet (40 mg total) by mouth daily., Disp: 90 tablet, Rfl: 1   sildenafil (VIAGRA) 50 MG tablet, Take 1 tablet (50 mg total) by mouth daily as needed. (please note decrease in strength and quantity change), Disp: 30 tablet, Rfl: 12   tamsulosin (FLOMAX) 0.4 MG CAPS capsule, Take 1 capsule (0.4 mg total) by mouth daily., Disp: 90 capsule, Rfl: 4   triamcinolone cream (KENALOG) 0.1 %, Apply to affected areas on body 2 times a day as needed (not to face, groin or underarms), Disp: 454 g, Rfl: 3    Allergies: Allergies  Allergen Reactions   Other     Hazelnut     REVIEW OF SYSTEMS:   Review of Systems  Constitutional:  Negative for chills, fatigue and fever.  HENT:   Negative for lump/mass, mouth sores, nosebleeds, sore throat and trouble swallowing.   Eyes:  Negative for eye problems.  Respiratory:  Negative for cough and shortness of breath.   Cardiovascular:  Negative for chest pain, leg swelling and palpitations.  Gastrointestinal:  Negative for abdominal pain, constipation, diarrhea, nausea and vomiting.  Genitourinary:  Negative for bladder incontinence, difficulty urinating, dysuria, frequency, hematuria and nocturia.   Musculoskeletal:  Negative for arthralgias, back pain, flank pain, myalgias and neck pain.  Skin:  Negative for itching and rash.  Neurological:  Negative for dizziness, headaches and numbness.  Hematological:  Does not bruise/bleed easily.  Psychiatric/Behavioral:  Negative for depression, sleep disturbance and suicidal ideas. The patient is not nervous/anxious.   All other systems reviewed and are negative.    VITALS:   There were no vitals taken for this visit.  Wt Readings from Last 3 Encounters:  04/16/22 174 lb 6.4 oz (79.1 kg)  03/26/22 169 lb 15.6 oz (77.1 kg)  03/23/22 170 lb (77.1 kg)    There is no height or weight on file to calculate BMI.  Performance status (ECOG): {CHL ONC [id number]  PHYSICAL EXAM:   Physical Exam Vitals and nursing note reviewed. Exam conducted with a chaperone present.  Constitutional:      Appearance: Normal appearance.  Cardiovascular:     Rate and Rhythm: Normal rate and regular rhythm.     Pulses: Normal pulses.     Heart sounds: Normal heart sounds.  Pulmonary:     Effort: Pulmonary effort is normal.     Breath sounds: Normal breath sounds.  Abdominal:     Palpations: Abdomen is soft. There is no hepatomegaly, splenomegaly or mass.     Tenderness: There is no abdominal tenderness.   Musculoskeletal:     Right lower leg: No edema.     Left lower leg: No edema.  Lymphadenopathy:     Cervical: No cervical adenopathy.     Right cervical: No superficial, deep or posterior cervical adenopathy.    Left cervical: No superficial, deep or posterior cervical adenopathy.     Upper Body:     Right upper body: No supraclavicular or axillary adenopathy.     Left upper body: No supraclavicular or axillary adenopathy.  Neurological:     General: No focal deficit present.     Mental Status: He is alert and oriented to person, place, and time.  Psychiatric:        Mood and Affect: Mood normal.  Behavior: Behavior normal.     LABS:      Latest Ref Rng & Units 01/30/2013   12:00 AM 05/13/2008   12:15 PM 07/20/2007   11:10 PM  CBC  WBC 4.0 - 10.5 K/uL 3.3  4.4  7.2   Hemoglobin 13.0 - 17.0 g/dL 82.9  56.2  13.0   Hematocrit 39.0 - 52.0 % 40.9  42.9  42.8   Platelets 150 - 400 K/uL 236  224  241       Latest Ref Rng & Units 01/30/2013   12:00 AM 05/13/2008   12:15 PM 07/20/2007   11:10 PM  CMP  Glucose 70 - 99 mg/dL 99  865  784   BUN   9  18   Creatinine   0.93  0.92   Sodium   136  135   Potassium   4.2  3.7   Chloride   99  101   CO2   31  27   Calcium   9.2  9.4   Total Protein   7.3  7.6   Total Bilirubin   0.9  1.1   Alkaline Phos   62  66   AST   22  29   ALT   19  27      No results found for: "CEA1", "CEA" / No results found for: "CEA1", "CEA" No results found for: "PSA1" No results found for: "ONG295" No results found for: "CAN125"  No results found for: "TOTALPROTELP", "ALBUMINELP", "A1GS", "A2GS", "BETS", "BETA2SER", "GAMS", "MSPIKE", "SPEI" No results found for: "TIBC", "FERRITIN", "IRONPCTSAT" No results found for: "LDH"   STUDIES:   No results found.

## 2023-04-14 ENCOUNTER — Inpatient Hospital Stay: Payer: Commercial Managed Care - PPO

## 2023-04-14 ENCOUNTER — Inpatient Hospital Stay: Payer: Commercial Managed Care - PPO | Attending: Hematology | Admitting: Hematology

## 2023-04-14 ENCOUNTER — Ambulatory Visit (HOSPITAL_COMMUNITY)
Admission: RE | Admit: 2023-04-14 | Discharge: 2023-04-14 | Disposition: A | Discharge status: Home / Self Care | Payer: Commercial Managed Care - PPO | Source: Ambulatory Visit | Attending: Hematology | Admitting: Hematology

## 2023-04-14 ENCOUNTER — Other Ambulatory Visit: Payer: Self-pay

## 2023-04-14 VITALS — BP 127/80 | HR 64 | Temp 97.9°F | Resp 18 | Ht 73.5 in | Wt 176.0 lb

## 2023-04-14 DIAGNOSIS — L409 Psoriasis, unspecified: Secondary | ICD-10-CM | POA: Diagnosis not present

## 2023-04-14 DIAGNOSIS — R0602 Shortness of breath: Secondary | ICD-10-CM | POA: Diagnosis not present

## 2023-04-14 DIAGNOSIS — R599 Enlarged lymph nodes, unspecified: Secondary | ICD-10-CM | POA: Diagnosis not present

## 2023-04-14 DIAGNOSIS — R590 Localized enlarged lymph nodes: Secondary | ICD-10-CM | POA: Insufficient documentation

## 2023-04-14 DIAGNOSIS — N400 Enlarged prostate without lower urinary tract symptoms: Secondary | ICD-10-CM | POA: Insufficient documentation

## 2023-04-14 LAB — CBC WITH DIFFERENTIAL/PLATELET
Abs Immature Granulocytes: 0.01 10*3/uL (ref 0.00–0.07)
Basophils Absolute: 0.1 10*3/uL (ref 0.0–0.1)
Basophils Relative: 1 %
Eosinophils Absolute: 0.1 10*3/uL (ref 0.0–0.5)
Eosinophils Relative: 4 %
HCT: 40.8 % (ref 39.0–52.0)
Hemoglobin: 14.4 g/dL (ref 13.0–17.0)
Immature Granulocytes: 0 %
Lymphocytes Relative: 42 %
Lymphs Abs: 1.6 10*3/uL (ref 0.7–4.0)
MCH: 32.1 pg (ref 26.0–34.0)
MCHC: 35.3 g/dL (ref 30.0–36.0)
MCV: 90.9 fL (ref 80.0–100.0)
Monocytes Absolute: 0.4 10*3/uL (ref 0.1–1.0)
Monocytes Relative: 9 %
Neutro Abs: 1.8 10*3/uL (ref 1.7–7.7)
Neutrophils Relative %: 44 %
Platelets: 238 10*3/uL (ref 150–400)
RBC: 4.49 MIL/uL (ref 4.22–5.81)
RDW: 11.6 % (ref 11.5–15.5)
WBC: 3.9 10*3/uL — ABNORMAL LOW (ref 4.0–10.5)
nRBC: 0 % (ref 0.0–0.2)

## 2023-04-14 LAB — COMPREHENSIVE METABOLIC PANEL
ALT: 21 U/L (ref 0–44)
AST: 23 U/L (ref 15–41)
Albumin: 3.9 g/dL (ref 3.5–5.0)
Alkaline Phosphatase: 55 U/L (ref 38–126)
Anion gap: 6 (ref 5–15)
BUN: 14 mg/dL (ref 8–23)
CO2: 27 mmol/L (ref 22–32)
Calcium: 8.8 mg/dL — ABNORMAL LOW (ref 8.9–10.3)
Chloride: 100 mmol/L (ref 98–111)
Creatinine, Ser: 0.85 mg/dL (ref 0.61–1.24)
GFR, Estimated: >60 mL/min (ref >60)
Glucose, Bld: 86 mg/dL (ref 70–99)
Potassium: 3.8 mmol/L (ref 3.5–5.1)
Sodium: 133 mmol/L — ABNORMAL LOW (ref 135–145)
Total Bilirubin: 0.8 mg/dL (ref 0.3–1.2)
Total Protein: 7.3 g/dL (ref 6.5–8.1)

## 2023-04-14 LAB — LACTATE DEHYDROGENASE: LDH: 112 U/L (ref 98–192)

## 2023-04-14 NOTE — Patient Instructions (Addendum)
[facility]  Discharge Instructions  You were seen and examined today by Dr. Ellin Saba. Dr. Ellin Saba is a medical oncologist, meaning that he specializes in the treatment of cancer diagnoses. Dr. Ellin Saba discussed your past medical history, family history of cancers, and the events that led to you being here today.  You were referred to Dr. Ellin Saba by Dr. Ouida Sills due to an enlarged axillary lymph node.  Dr. Ellin Saba has recommended additional labs today for further evaluation. For the sake of completing the work-up entirely, Dr. Ellin Saba will also order an ultrasound of that axilla. It is quite possible that the lymphadenopathy related to psoriasis.  Follow-up as scheduled following the ultrasound.  Thank you for choosing [facility] to provide your oncology and hematology care.   To afford each patient quality time with our provider, please arrive at least 15 minutes before your scheduled appointment time. You may need to reschedule your appointment if you arrive late (10 or more minutes). Arriving late affects you and other patients whose appointments are after yours.  Also, if you miss three or more appointments without notifying the office, you may be dismissed from the clinic at the provider's discretion.    Again, thank you for choosing [facility].  Our hope is that these requests will decrease the amount of time that you wait before being seen by our physicians.   If you have a lab appointment with the Cancer Center - please note that after April 8th, all labs will be drawn in the cancer center.  You do not have to check in or register with the main entrance as you have in the past but will complete your check-in at the cancer center.            _____________________________________________________________  Should you have questions after your visit to [facility], please contact our office at  [phone] and follow the prompts.  Our office hours are 8:00 a.m. to 4:30 p.m. Monday - Thursday and 8:00 a.m. to 2:30 p.m. Friday.  Please note that voicemails left after 4:00 p.m. may not be returned until the following business day.  We are closed weekends and all major holidays.  You do have access to a nurse 24-7, just call the main number to the clinic [phone] and do not press any options, hold on the line and a nurse will answer the phone.    For prescription refill requests, have your pharmacy contact our office and allow 72 hours.    Masks are no longer required in the cancer centers. If you would like for your care team to wear a mask while they are taking care of you, please let them know. You may have one support person who is at least 62 years old accompany you for your appointments.

## 2023-04-15 LAB — EPSTEIN-BARR VIRUS (EBV) ANTIBODY PROFILE
EBV NA IgG: >600 U/mL — ABNORMAL HIGH (ref 0.0–17.9)
EBV VCA IgG: >600 U/mL — ABNORMAL HIGH (ref 0.0–17.9)
EBV VCA IgM: <36 U/mL (ref 0.0–35.9)

## 2023-04-15 LAB — ANGIOTENSIN CONVERTING ENZYME: Angiotensin-Converting Enzyme: 51 U/L (ref 14–82)

## 2023-04-18 LAB — ANTINUCLEAR ANTIBODIES, IFA: ANA Ab, IFA: NEGATIVE

## 2023-05-02 ENCOUNTER — Other Ambulatory Visit: Payer: Self-pay

## 2023-05-03 ENCOUNTER — Other Ambulatory Visit (HOSPITAL_COMMUNITY): Payer: Self-pay

## 2023-05-06 ENCOUNTER — Other Ambulatory Visit (HOSPITAL_COMMUNITY): Payer: Self-pay

## 2023-05-14 ENCOUNTER — Encounter (HOSPITAL_COMMUNITY): Payer: Self-pay

## 2023-06-08 ENCOUNTER — Other Ambulatory Visit: Payer: Self-pay

## 2023-06-09 ENCOUNTER — Other Ambulatory Visit: Payer: Self-pay

## 2023-06-09 ENCOUNTER — Other Ambulatory Visit (HOSPITAL_COMMUNITY): Payer: Self-pay

## 2023-06-09 DIAGNOSIS — L400 Psoriasis vulgaris: Secondary | ICD-10-CM | POA: Diagnosis not present

## 2023-06-09 DIAGNOSIS — L570 Actinic keratosis: Secondary | ICD-10-CM | POA: Diagnosis not present

## 2023-06-09 DIAGNOSIS — D225 Melanocytic nevi of trunk: Secondary | ICD-10-CM | POA: Diagnosis not present

## 2023-06-09 DIAGNOSIS — Z1283 Encounter for screening for malignant neoplasm of skin: Secondary | ICD-10-CM | POA: Diagnosis not present

## 2023-06-09 DIAGNOSIS — X32XXXD Exposure to sunlight, subsequent encounter: Secondary | ICD-10-CM | POA: Diagnosis not present

## 2023-06-09 MED ORDER — OTEZLA 30 MG PO TABS
ORAL_TABLET | ORAL | 3 refills | Status: DC
  Filled 2023-07-10: qty 180, fill #0

## 2023-06-09 NOTE — Progress Notes (Signed)
Specialty Pharmacy Refill Coordination Note  Gary Fischer is a 62 y.o. male contacted today regarding refills of specialty medication(s) Apremilast   Patient requested Delivery   Delivery date: 06/14/23   Verified address: [address], [place], [address]   Medication will be filled on 06/13/23.

## 2023-06-10 ENCOUNTER — Other Ambulatory Visit (HOSPITAL_COMMUNITY): Payer: Self-pay

## 2023-06-10 ENCOUNTER — Other Ambulatory Visit: Payer: Self-pay

## 2023-06-10 MED ORDER — TRIAMCINOLONE ACETONIDE 0.1 % EX CREA
1.0000 | TOPICAL_CREAM | Freq: Two times a day (BID) | CUTANEOUS | 3 refills | Status: AC | PRN
  Filled 2024-04-15: qty 454, 90d supply, fill #0

## 2023-06-10 MED ORDER — CALCIPOTRIENE 0.005 % EX CREA
1.0000 | TOPICAL_CREAM | Freq: Two times a day (BID) | CUTANEOUS | 3 refills | Status: AC | PRN
  Filled 2023-06-10: qty 60, 30d supply, fill #0
  Filled 2024-01-11: qty 60, 30d supply, fill #1

## 2023-06-13 ENCOUNTER — Other Ambulatory Visit: Payer: Self-pay

## 2023-06-13 ENCOUNTER — Other Ambulatory Visit (HOSPITAL_COMMUNITY): Payer: Self-pay

## 2023-06-14 ENCOUNTER — Other Ambulatory Visit: Payer: Self-pay

## 2023-06-24 ENCOUNTER — Other Ambulatory Visit (HOSPITAL_COMMUNITY): Payer: Self-pay | Admitting: Hematology

## 2023-06-27 ENCOUNTER — Other Ambulatory Visit: Payer: Self-pay

## 2023-06-27 ENCOUNTER — Encounter: Payer: Self-pay | Admitting: General Practice

## 2023-06-27 DIAGNOSIS — R590 Localized enlarged lymph nodes: Secondary | ICD-10-CM

## 2023-06-27 NOTE — Progress Notes (Signed)
Biopsy order placed per Dr. Ellin Saba verbal order.

## 2023-06-27 NOTE — Progress Notes (Unsigned)
Oley Balm, MD  [name], MD Cc: Caroleen Hamman, NT No adenopathy identified on [place] axilla 04/14/23 Is there more recent outside imaging showing new findings?  DD[facility]       Previous Messages    ----- Message ----- From: Claudean Kinds Sent: 06/27/2023   9:13 AM EST To: Caroleen Hamman, NT; Ir Procedure Requests Subject: CT biopsy                                      Procedure : CT biopsy  Reason: L axillary lymphadenopathy Dx: Axillary lymphadenopathy [R59.0 (ICD-10-CM)]    History: [place] axilla left , Dg chest 2 view  Provider: [name], MD   Provider contact : [phone]

## 2023-06-29 ENCOUNTER — Other Ambulatory Visit: Payer: Self-pay

## 2023-06-29 DIAGNOSIS — R590 Localized enlarged lymph nodes: Secondary | ICD-10-CM

## 2023-06-29 DIAGNOSIS — R0602 Shortness of breath: Secondary | ICD-10-CM

## 2023-06-29 NOTE — Progress Notes (Signed)
CT CAP order placed per Dr. Ellin Saba verbal order

## 2023-07-01 ENCOUNTER — Ambulatory Visit (HOSPITAL_COMMUNITY)
Admission: RE | Admit: 2023-07-01 | Discharge: 2023-07-01 | Disposition: A | Discharge status: Home / Self Care | Payer: Commercial Managed Care - PPO | Source: Ambulatory Visit | Attending: Hematology | Admitting: Hematology

## 2023-07-01 ENCOUNTER — Other Ambulatory Visit: Payer: Self-pay

## 2023-07-01 DIAGNOSIS — R599 Enlarged lymph nodes, unspecified: Secondary | ICD-10-CM | POA: Diagnosis not present

## 2023-07-01 DIAGNOSIS — R0602 Shortness of breath: Secondary | ICD-10-CM | POA: Diagnosis not present

## 2023-07-01 DIAGNOSIS — R590 Localized enlarged lymph nodes: Secondary | ICD-10-CM | POA: Insufficient documentation

## 2023-07-01 DIAGNOSIS — N400 Enlarged prostate without lower urinary tract symptoms: Secondary | ICD-10-CM | POA: Diagnosis not present

## 2023-07-01 MED ORDER — IOHEXOL 300 MG/ML  SOLN
100.0000 mL | Freq: Once | INTRAMUSCULAR | Status: AC | PRN
  Administered 2023-07-01: 100 mL via INTRAVENOUS

## 2023-07-05 ENCOUNTER — Other Ambulatory Visit (HOSPITAL_COMMUNITY): Payer: Self-pay

## 2023-07-06 NOTE — Progress Notes (Signed)
[name], MD  Charma Igo, Katheren Shams, [place]; Oley Balm, MD OK to cancel biopsy       Previous Messages    ----- Message ----- From: Claudean Kinds Sent: 07/05/2023   1:49 PM EST To: Oley Balm, MD; [name], MD; * Subject: RE: CT biopsy                                  CT has been done - is it okay to cancel ct biopsy order ? ----- Message ----- From: Caroleen Hamman, NT Sent: 06/27/2023  11:51 AM EST To: Oley Balm, MD; [name], MD; * Subject: RE: CT biopsy                                  There is no other imaging in PACS that I can see. I did message Dr Kirtland Bouchard to see how he would like to precede. ----- Message ----- From: Oley Balm, MD Sent: 06/27/2023   9:28 AM EST To: [name], MD; Caroleen Hamman, NT Subject: FW: CT biopsy                                  No adenopathy identified on [place] axilla 04/14/23 Is there more recent outside imaging showing new findings?  DDH ----- Message ----- From: Claudean Kinds Sent: 06/27/2023   9:13 AM EST To: Caroleen Hamman, NT; Ir Procedure Requests Subject: CT biopsy                                      Procedure : CT biopsy  Reason: L axillary lymphadenopathy Dx: Axillary lymphadenopathy [R59.0 (ICD-10-CM)]    History: [place] axilla left , Dg chest 2 view  Provider: [name], MD   Provider contact : [phone]

## 2023-07-10 ENCOUNTER — Other Ambulatory Visit (HOSPITAL_COMMUNITY): Payer: Self-pay

## 2023-07-11 ENCOUNTER — Other Ambulatory Visit: Payer: Self-pay

## 2023-07-11 ENCOUNTER — Encounter (HOSPITAL_COMMUNITY): Payer: Self-pay

## 2023-07-11 ENCOUNTER — Other Ambulatory Visit (HOSPITAL_COMMUNITY): Payer: Self-pay

## 2023-07-11 ENCOUNTER — Other Ambulatory Visit (HOSPITAL_COMMUNITY): Payer: Self-pay | Admitting: Pharmacy Technician

## 2023-07-11 MED ORDER — ROSUVASTATIN CALCIUM 40 MG PO TABS
40.0000 mg | ORAL_TABLET | Freq: Every day | ORAL | 4 refills | Status: DC
  Filled 2023-07-11: qty 90, 90d supply, fill #0
  Filled 2023-10-06: qty 90, 90d supply, fill #1
  Filled 2024-01-11: qty 90, 90d supply, fill #2
  Filled 2024-03-02 – 2024-04-15 (×2): qty 90, 90d supply, fill #3

## 2023-07-11 NOTE — Progress Notes (Signed)
Specialty Pharmacy Refill Coordination Note  Gary Fischer is a 62 y.o. male contacted today regarding refills of specialty medication(s) Apremilast   Patient requested Delivery   Delivery date: 07/15/23   Verified address: [address]  [name]   Medication will be filled on 07/14/23.

## 2023-07-11 NOTE — Progress Notes (Signed)
Specialty Pharmacy Ongoing Clinical Assessment Note  Gary Fischer is a 62 y.o. male who is being followed by the specialty pharmacy service for RxSp Psoriasis   Patient's specialty medication(s) reviewed today: Apremilast   Missed doses in the last 4 weeks: 0   Patient/Caregiver did not have any additional questions or concerns.   Therapeutic benefit summary: Patient is achieving benefit   Adverse events/side effects summary: No adverse events/side effects   Patient's therapy is appropriate to: Continue    Goals Addressed             This Visit's Progress    Minimize recurrence of flares       Patient is on track. Patient will maintain adherence         Follow up:  6 months  Bobette Mo Specialty Pharmacist

## 2023-07-12 ENCOUNTER — Other Ambulatory Visit: Payer: Self-pay

## 2023-07-14 ENCOUNTER — Other Ambulatory Visit: Payer: Self-pay

## 2023-07-25 ENCOUNTER — Inpatient Hospital Stay: Payer: Commercial Managed Care - PPO

## 2023-07-26 ENCOUNTER — Inpatient Hospital Stay: Payer: Commercial Managed Care - PPO | Admitting: Hematology

## 2023-07-29 DIAGNOSIS — H43811 Vitreous degeneration, right eye: Secondary | ICD-10-CM | POA: Diagnosis not present

## 2023-08-01 ENCOUNTER — Other Ambulatory Visit (HOSPITAL_COMMUNITY): Payer: Self-pay

## 2023-08-02 DIAGNOSIS — Z0000 Encounter for general adult medical examination without abnormal findings: Secondary | ICD-10-CM | POA: Diagnosis not present

## 2023-08-05 ENCOUNTER — Other Ambulatory Visit: Payer: Self-pay

## 2023-08-05 NOTE — Progress Notes (Signed)
Specialty Pharmacy Refill Coordination Note  Gary Fischer is a 62 y.o. male contacted today regarding refills of specialty medication(s) Apremilast [name])   Patient requested Delivery   Delivery date: 08/12/23   Verified address: [address] [place] [address]   Medication will be filled on 08/11/23.

## 2023-08-09 ENCOUNTER — Other Ambulatory Visit: Payer: Self-pay

## 2023-08-10 ENCOUNTER — Other Ambulatory Visit: Payer: Self-pay

## 2023-08-11 ENCOUNTER — Other Ambulatory Visit: Payer: Self-pay

## 2023-08-19 ENCOUNTER — Other Ambulatory Visit: Payer: Self-pay

## 2023-08-30 ENCOUNTER — Other Ambulatory Visit: Payer: Self-pay

## 2023-09-02 ENCOUNTER — Other Ambulatory Visit (HOSPITAL_COMMUNITY): Payer: Self-pay

## 2023-09-02 ENCOUNTER — Other Ambulatory Visit (HOSPITAL_COMMUNITY): Payer: Self-pay | Admitting: Pharmacy Technician

## 2023-09-02 NOTE — Progress Notes (Signed)
 Specialty Pharmacy Refill Coordination Note  Gary Fischer is a 63 y.o. male contacted today regarding refills of specialty medication(s) Apremilast  (Otezla )   Patient requested Delivery   Delivery date: 09/09/23   Verified address: [address]  [place]   Medication will be filled on 09/08/23.

## 2023-09-08 ENCOUNTER — Other Ambulatory Visit: Payer: Self-pay

## 2023-09-29 ENCOUNTER — Other Ambulatory Visit: Payer: Self-pay

## 2023-09-29 ENCOUNTER — Other Ambulatory Visit (HOSPITAL_COMMUNITY): Payer: Self-pay

## 2023-09-29 NOTE — Progress Notes (Signed)
 Specialty Pharmacy Refill Coordination Note  Gary Fischer is a 63 y.o. male contacted today regarding refills of specialty medication(s) Apremilast  (Otezla )   Patient requested Delivery   Delivery date: 10/13/23   Verified address: [address]   [place] [address]   Medication will be filled on 10/12/23.

## 2023-10-06 ENCOUNTER — Other Ambulatory Visit: Payer: Self-pay

## 2023-10-06 ENCOUNTER — Other Ambulatory Visit (HOSPITAL_COMMUNITY): Payer: Self-pay

## 2023-10-06 MED ORDER — SILDENAFIL CITRATE 50 MG PO TABS
50.0000 mg | ORAL_TABLET | ORAL | 12 refills | Status: AC | PRN
Start: 2023-10-06 — End: ?
  Filled 2023-10-06: qty 6, 30d supply, fill #0

## 2023-10-10 ENCOUNTER — Other Ambulatory Visit: Payer: Self-pay

## 2023-10-10 NOTE — Progress Notes (Signed)
 Patient was contacted via mychart that due to possible impending winter storm, medication will arrive on Tuesday 10/11/23.

## 2023-11-03 ENCOUNTER — Other Ambulatory Visit: Payer: Self-pay

## 2023-11-03 ENCOUNTER — Other Ambulatory Visit (HOSPITAL_COMMUNITY): Payer: Self-pay

## 2023-11-03 NOTE — Progress Notes (Signed)
 Specialty Pharmacy Refill Coordination Note  CARLON CHALOUX is a 63 y.o. male contacted today regarding refills of specialty medication(s) [name].  Patient requested (Patient-Rptd) Delivery   Delivery date: (Patient-Rptd) 11/10/23   Verified address: (Patient-Rptd) [address]   Medication will be filled on 11/09/23.

## 2023-11-04 ENCOUNTER — Other Ambulatory Visit: Payer: Self-pay

## 2023-11-07 ENCOUNTER — Other Ambulatory Visit: Payer: Self-pay

## 2023-11-07 NOTE — Progress Notes (Signed)
 Pharmacy Patient Advocate Encounter   Received notification from Patient Pharmacy that prior authorization for Gary Fischer is required/requested.   Insurance verification completed.   The patient is insured through [facility] .   Per test claim: PA required; PA submitted to above mentioned insurance via CoverMyMeds Key/confirmation #/EOC [id number] Status is pending

## 2023-11-07 NOTE — Progress Notes (Signed)
 [facility] [address] [place] [address]   Clinic Day:  11/08/23   Referring physician: [name], MD  Patient Care Team: Gary Perches, MD as PCP - General (Internal Medicine) Gary Rotunda, MD as PCP - Cardiology (Cardiology) Gary Gauss Gerrit Friends, MD (Gastroenterology)   ASSESSMENT & PLAN:   Assessment:  1.  Left axillary adenopathy: - Hiking trip to [facility] [place]) in June 2024. - Mid July 2024, felt soreness in the bilateral axillary region, left side more than right side.  Also felt a small lymph node in the left axilla. - Evaluated by Dr. Ouida Fischer (03/17/2023): Subcentimeter lymph node felt in the left axillary region.  Treated with Keflex. - Denies fever/night sweats/weight loss.  Appetite is good.  Does not have cats/dogs.  No tick bites or bug bites reported. - Slightly more increased shortness of breath when running/doing aerobic exercises in the last few months. - Diagnosed with COVID last week. - Had psoriasis for 10 years, on [place] for 6 years. - Left axillary ultrasound (04/14/2023): 2 adjacent nonspecific/benign-appearing lymph nodes in the left axilla measuring 0.9 x 0.8 x 0.8 cm and 0.6 x 1.7 cm. - CT CAP (07/01/2023): No pathologically enlarged lymph nodes within the chest, abdomen or pelvis.  Enlarged prostate gland.   2. Family History: -No family history of malignancies.  Plan:  1.  Left axillary lymphadenopathy: - Since last visit, no significant weight loss, fevers or night sweats. - He has occasional aching/soreness and feels like skin scalded in the left axillary region. - No tenderness in the left axilla.  Very small high left axillary lymph node palpable. - Will repeat left axillary ultrasound. - If the ultrasound is normal, return to clinic in 1 year with repeat labs and left axillary ultrasound.   Orders Placed This Encounter  Procedures   [place] AXILLA LEFT    Standing Status:   Future    Expected Date:    11/15/2023    Expiration Date:   11/07/2024    Reason for Exam (SYMPTOM  OR DIAGNOSIS REQUIRED):   axillary lymphadenopathy    Preferred imaging location?:   [facility]   [place] AXILLA LEFT    Standing Status:   Future    Expected Date:   10/30/2024    Expiration Date:   11/07/2024    Reason for Exam (SYMPTOM  OR DIAGNOSIS REQUIRED):   left axillary lymphadenopathy    Preferred imaging location?:   [facility]      I,Gary Fischer,acting as a scribe for Gary Massed, MD.,have documented all relevant documentation on the behalf of Gary Massed, MD,as directed by  Gary Massed, MD while in the presence of Gary Massed, MD.  I, Gary Massed MD, have reviewed the above documentation for accuracy and completeness, and I agree with the above.    Gary Massed, MD   3/18/20254:12 PM  CHIEF COMPLAINT/PURPOSE OF CONSULT:   Diagnosis: Left axillary adenopathy   Cancer Staging  No matching staging information was found for the patient.    Prior Therapy: None  Current Therapy: Under workup   HISTORY OF PRESENT ILLNESS:   Oncology History   No history exists.      Gary Fischer is a 63 y.o. male presenting to clinic today for evaluation of enlarged left axillary lymph node at the request of Gary Perches, MD.  Patient recently went to his PCP, Dr. Ouida Fischer, on 03/17/23 when he developed mild discomfort in the area  of his left axillary lymph node. He was referred to me after this visit.   Today, he states that he is doing well overall. His appetite level is at 100%. His energy level is at 75%.  He first felt the discomfort and soreness of the left axillary lymph node 2 weeks before his visit with Dr. Ouida Fischer. He notes he did not feel anything upon first palpation, but then 7-10 days before his visit to Dr. Ouida Fischer he felt the area around his left axillary lymph node and noted increased soreness to the area, as well as soreness to the right axillary  lymph node. He also reportedly felt a very small right axillary lymph node at the time, though it is not palpable today. He does note that he is not able to run at the usual pace he is used to without resting due to increased SOB. He reports he gets anxious about his symptoms and becomes short of breath when sitting or in the middle of the night. He also notes increased SOB when exercising and doing anaerobic activities. He denies tick bites, cellulitis, fever, night sweats, unexpected weight loss, or changes in appetite. He is still able to do normal activities at work. He reports he was diagnosed with COVID last week that is almost resolved and attributes some of his increased SOB to this. He went to a [employer] in June 2024 and notes there were warning signs there that prairie dogs had fleas on them that had the plague, though he does not believe he contracted it there.   He notes he has small lymph nodes in the back of his neck that have been present since he had mono in high school.   He has BPH with elevated PSA that is stable around 2. He also has had psoriasis for the last 10 years. On Otezla for the last 6 years. He currently has psoroidal rash on the right upper shoulder and right elbow. He believes his psoriasis is reasonably controlled with [name], in that his rashes no longer itch though the rash remains. He notes he has never had lymph node swellings from his psoriasis. He does not have DM.   INTERVAL HISTORY:   Gary Fischer is a 63 y.o. male presenting to the clinic today for follow-up of left axillary adenopathy. He was last seen by me on 04/14/23 in consultation.  Since his last visit, he underwent US left axilla on 04/14/23 that found: Two adjacent nonspecific/benign-appearing lymph nodes in the left axilla in the region of palpable mass.  Gary Fischer also had CT C/A/P on 07/01/23 that found: No pathologically enlarged lymph nodes within the chest, abdomen or pelvis.  Heterogeneous enhancement of an enlarged prostate gland.   Today, he states that he is doing well overall. His appetite level is at 100%. His energy level is at 100%. Gary Fischer notes intermittent left axillary aching pain not reproducible on palpation, similar to a mild scalding or sensitivity sensation. He denies any fevers or night sweats. [place] states he has normal appetite and energy levels.   PAST MEDICAL HISTORY:   Past Medical History: Past Medical History:  Diagnosis Date   Psoriasis     Surgical History: Past Surgical History:  Procedure Laterality Date   COLONOSCOPY N/A 02/27/2016   Procedure: COLONOSCOPY;  Surgeon: Corbin Ade, MD;  [facility];  Service: Endoscopy;  Laterality: N/A;  7:30 Am   COLONOSCOPY WITH PROPOFOL N/A 03/26/2022   Procedure: COLONOSCOPY WITH PROPOFOL;  Surgeon: Corbin Ade, MD;  [facility];  Service: Endoscopy;  Laterality: N/A;  8:30am, asa 2   NOSE SURGERY     devated septum   POLYPECTOMY  03/26/2022   Procedure: POLYPECTOMY;  Surgeon: Corbin Ade, MD;  [facility];  Service: Endoscopy;;    Social History: Social History   Socioeconomic History   Marital status: Married    Spouse name: Not on file   Number of children: Not on file   Years of education: Not on file   Highest education level: Not on file  Occupational History   Not on file  Tobacco Use   Smoking status: Never   Smokeless tobacco: Never  Vaping Use   Vaping status: Never Used  Substance and Sexual Activity   Alcohol use: No    Alcohol/week: 0.0 standard drinks of alcohol   Drug use: No   Sexual activity: Yes  Other Topics Concern   Not on file  Social History Narrative   Not on file   Social Drivers of Health   Financial Resource Strain: Not on file  Food Insecurity: Not on file  Transportation Needs: Not on file  Physical Activity: Not on file  Stress: Not on file  Social Connections: Not on file  Intimate Partner  Violence: Not on file    Family History: Family History  Problem Relation Age of Onset   Hyperlipidemia Mother    Alzheimer's disease Mother    Hyperlipidemia Father     Current Medications:  Current Outpatient Medications:    Apremilast 30 MG TABS, Take 1 tablet (30 mg total) by mouth 2 (two) times daily with food, Disp: 180 tablet, Rfl: 3   calcipotriene (DOVONOX) 0.005 % cream, Apply 1 Application topically 2 (two) times daily as needed., Disp: 60 g, Rfl: 3   rosuvastatin (CRESTOR) 40 MG tablet, Take 1 tablet (40 mg total) by mouth daily., Disp: 90 tablet, Rfl: 4   sildenafil (VIAGRA) 50 MG tablet, Take 1 tablet (50 mg total) by mouth as needed., Disp: 30 tablet, Rfl: 12   tamsulosin (FLOMAX) 0.4 MG CAPS capsule, Take 1 capsule (0.4 mg total) by mouth daily. (Patient taking differently: Take 0.4 mg by mouth daily. As needed three times a week), Disp: 90 capsule, Rfl: 4   triamcinolone cream (KENALOG) 0.1 %, Apply 1 Application topically 2 (two) times daily as needed. Avoid face, groin, and underarms., Disp: 453.6 g, Rfl: 3   Allergies: Allergies  Allergen Reactions   Other     Hazelnut     REVIEW OF SYSTEMS:   Review of Systems  Constitutional:  Negative for chills, fatigue and fever.  HENT:   Negative for lump/mass, mouth sores, nosebleeds, sore throat and trouble swallowing.   Eyes:  Negative for eye problems.  Respiratory:  Negative for cough and shortness of breath.   Cardiovascular:  Negative for chest pain, leg swelling and palpitations.  Gastrointestinal:  Negative for abdominal pain, constipation, diarrhea, nausea and vomiting.  Genitourinary:  Negative for bladder incontinence, difficulty urinating, dysuria, frequency, hematuria and nocturia.   Musculoskeletal:  Negative for arthralgias, back pain, flank pain, myalgias and neck pain.       +left axillary pain, 5/10 severity  Skin:  Negative for itching and rash.  Neurological:  Negative for dizziness, headaches  and numbness.  Hematological:  Does not bruise/bleed easily.  Psychiatric/Behavioral:  Negative for depression, sleep disturbance and suicidal ideas. The patient is not nervous/anxious.   All other  systems reviewed and are negative.    VITALS:   Blood pressure 116/74, pulse (!) 54, temperature 98.3 F (36.8 C), temperature source Oral, resp. rate 18, height 6\' 1"  (1.854 m), weight 181 lb 8 oz (82.3 kg), SpO2 100%.  Wt Readings from Last 3 Encounters:  11/08/23 181 lb 8 oz (82.3 kg)  04/14/23 176 lb (79.8 kg)  04/16/22 174 lb 6.4 oz (79.1 kg)    Body mass index is 23.95 kg/m.  Performance status (ECOG): 0 - Asymptomatic  PHYSICAL EXAM:   Physical Exam Vitals and nursing note reviewed. Exam conducted with a chaperone present.  Constitutional:      Appearance: Normal appearance.  Cardiovascular:     Rate and Rhythm: Normal rate and regular rhythm.     Pulses: Normal pulses.     Heart sounds: Normal heart sounds.  Pulmonary:     Effort: Pulmonary effort is normal.     Breath sounds: Normal breath sounds.  Abdominal:     Palpations: Abdomen is soft. There is no hepatomegaly, splenomegaly or mass.     Tenderness: There is no abdominal tenderness.  Musculoskeletal:     Right lower leg: No edema.     Left lower leg: No edema.  Lymphadenopathy:     Cervical: No cervical adenopathy.     Right cervical: No superficial, deep or posterior cervical adenopathy.    Left cervical: No superficial, deep or posterior cervical adenopathy.     Upper Body:     Right upper body: No supraclavicular or axillary adenopathy.     Left upper body: No supraclavicular or axillary adenopathy.  Neurological:     General: No focal deficit present.     Mental Status: He is alert and oriented to person, place, and time.  Psychiatric:        Mood and Affect: Mood normal.        Behavior: Behavior normal.     LABS:      Latest Ref Rng & Units 04/14/2023    8:40 AM 01/30/2013   12:00 AM 05/13/2008    12:15 PM  CBC  WBC 4.0 - 10.5 K/uL 3.9  3.3  4.4   Hemoglobin 13.0 - 17.0 g/dL 40.9  81.1  91.4   Hematocrit 39.0 - 52.0 % 40.8  40.9  42.9   Platelets 150 - 400 K/uL 238  236  224       Latest Ref Rng & Units 04/14/2023    8:40 AM 01/30/2013   12:00 AM 05/13/2008   12:15 PM  CMP  Glucose 70 - 99 mg/dL 86  99  782   BUN 8 - 23 mg/dL 14   9   Creatinine 9.56 - 1.24 mg/dL 2.13   0.86   Sodium 578 - 145 mmol/L 133   136   Potassium 3.5 - 5.1 mmol/L 3.8   4.2   Chloride 98 - 111 mmol/L 100   99   CO2 22 - 32 mmol/L 27   31   Calcium 8.9 - 10.3 mg/dL 8.8   9.2   Total Protein 6.5 - 8.1 g/dL 7.3   7.3   Total Bilirubin 0.3 - 1.2 mg/dL 0.8   0.9   Alkaline Phos 38 - 126 U/L 55   62   AST 15 - 41 U/L 23   22   ALT 0 - 44 U/L 21   19      No results found for: "CEA1", "CEA" / No results found for: "  CEA1", "CEA" No results found for: "PSA1" No results found for: "WUJ811" No results found for: "CAN125"  No results found for: "TOTALPROTELP", "ALBUMINELP", "A1GS", "A2GS", "BETS", "BETA2SER", "GAMS", "MSPIKE", "SPEI" No results found for: "TIBC", "FERRITIN", "IRONPCTSAT" Lab Results  Component Value Date   LDH 112 04/14/2023     STUDIES:   No results found.

## 2023-11-07 NOTE — Progress Notes (Signed)
 Pharmacy Patient Advocate Encounter  Received notification from [facility] that Prior Authorization for Gary Fischer has been APPROVED from 11/07/23 to 11/06/24   PA #/Case ID/Reference #:  [id number]

## 2023-11-08 ENCOUNTER — Inpatient Hospital Stay: Payer: Commercial Managed Care - PPO | Attending: Hematology | Admitting: Hematology

## 2023-11-08 VITALS — BP 116/74 | HR 54 | Temp 98.3°F | Resp 18 | Ht 73.0 in | Wt 181.5 lb

## 2023-11-08 DIAGNOSIS — R590 Localized enlarged lymph nodes: Secondary | ICD-10-CM | POA: Diagnosis not present

## 2023-11-08 DIAGNOSIS — N400 Enlarged prostate without lower urinary tract symptoms: Secondary | ICD-10-CM | POA: Diagnosis not present

## 2023-11-08 DIAGNOSIS — L409 Psoriasis, unspecified: Secondary | ICD-10-CM | POA: Insufficient documentation

## 2023-11-08 NOTE — Patient Instructions (Addendum)
 [facility] at [facility] Discharge Instructions   You were seen and examined today by Dr. Ellin Saba.  He reviewed the results of your CT scan which was normal.  We will see you back in one year. We will repeat labs and an ultrasound prior to this visit.   Return as scheduled.    Thank you for choosing [facility] at [facility] to provide your oncology and hematology care.  To afford each patient quality time with our provider, please arrive at least 15 minutes before your scheduled appointment time.   If you have a lab appointment with the Cancer Center please come in thru the Main Entrance and check in at the main information desk.  You need to re-schedule your appointment should you arrive 10 or more minutes late.  We strive to give you quality time with our providers, and arriving late affects you and other patients whose appointments are after yours.  Also, if you no show three or more times for appointments you may be dismissed from the clinic at the providers discretion.     Again, thank you for choosing [facility].  Our hope is that these requests will decrease the amount of time that you wait before being seen by our physicians.       _____________________________________________________________  Should you have questions after your visit to [facility], please contact our office at [phone] and follow the prompts.  Our office hours are 8:00 a.m. and 4:30 p.m. Monday - Friday.  Please note that voicemails left after 4:00 p.m. may not be returned until the following business day.  We are closed weekends and major holidays.  You do have access to a nurse 24-7, just call the main number to the clinic [phone] and do not press any options, hold on the line and a nurse will answer the phone.    For prescription refill requests, have your pharmacy contact our office and allow 72 hours.    Due to Covid, you  will need to wear a mask upon entering the hospital. If you do not have a mask, a mask will be given to you at the Main Entrance upon arrival. For doctor visits, patients may have 1 support person age 32 or older with them. For treatment visits, patients can not have anyone with them due to social distancing guidelines and our immunocompromised population.

## 2023-11-09 ENCOUNTER — Other Ambulatory Visit: Payer: Self-pay

## 2023-11-17 ENCOUNTER — Ambulatory Visit (HOSPITAL_COMMUNITY)
Admission: RE | Admit: 2023-11-17 | Discharge: 2023-11-17 | Disposition: A | Discharge status: Home / Self Care | Source: Ambulatory Visit | Attending: Hematology | Admitting: Hematology

## 2023-11-17 DIAGNOSIS — R599 Enlarged lymph nodes, unspecified: Secondary | ICD-10-CM | POA: Diagnosis not present

## 2023-11-17 DIAGNOSIS — R590 Localized enlarged lymph nodes: Secondary | ICD-10-CM | POA: Diagnosis not present

## 2023-11-29 ENCOUNTER — Other Ambulatory Visit: Payer: Self-pay

## 2023-11-29 NOTE — Progress Notes (Signed)
 Specialty Pharmacy Refill Coordination Note  Gary Fischer is a 63 y.o. male contacted today regarding refills of specialty medication(s) Apremilast   Patient requested (Patient-Rptd) Delivery   Delivery date: (Patient-Rptd) 12/12/23   Verified address: (Patient-Rptd) [address]   Medication will be filled on 12/09/23

## 2023-12-01 LAB — LAB REPORT - SCANNED
A1c: 5.5
EGFR (Non-African Amer.): 79
TSH: 2.32 (ref 0.41–5.90)

## 2023-12-09 ENCOUNTER — Other Ambulatory Visit: Payer: Self-pay

## 2023-12-28 ENCOUNTER — Other Ambulatory Visit: Payer: Self-pay

## 2023-12-29 ENCOUNTER — Other Ambulatory Visit: Payer: Self-pay

## 2023-12-29 ENCOUNTER — Other Ambulatory Visit (HOSPITAL_COMMUNITY): Payer: Self-pay

## 2023-12-29 NOTE — Progress Notes (Signed)
 Specialty Pharmacy Refill Coordination Note  Gary Fischer is a 63 y.o. male contacted today regarding refills of specialty medication(s) Otezla .  Patient requested (Patient-Rptd) Delivery   Delivery date: (Patient-Rptd) 01/10/24   Verified address: (Patient-Rptd) [address]   Medication will be filled on 01/09/24.

## 2024-01-02 ENCOUNTER — Other Ambulatory Visit: Payer: Self-pay

## 2024-01-09 ENCOUNTER — Other Ambulatory Visit: Payer: Self-pay

## 2024-01-11 ENCOUNTER — Other Ambulatory Visit (HOSPITAL_COMMUNITY): Payer: Self-pay

## 2024-01-31 ENCOUNTER — Other Ambulatory Visit: Payer: Self-pay

## 2024-01-31 NOTE — Progress Notes (Signed)
 Specialty Pharmacy Refill Coordination Note  Gary Fischer is a 63 y.o. male contacted today regarding refills of specialty medication(s) Apremilast    Patient requested Delivery   Delivery date: 02/08/24   Verified address: [address], [place] [address]   Medication will be filled on 02/07/24.

## 2024-01-31 NOTE — Progress Notes (Signed)
 Specialty Pharmacy Ongoing Clinical Assessment Note  Gary Fischer is a 63 y.o. male who is being followed by the specialty pharmacy service for RxSp Psoriasis   Patient's specialty medication(s) reviewed today: Apremilast    Missed doses in the last 4 weeks: 1   Patient/Caregiver did not have any additional questions or concerns.   Therapeutic benefit summary: Patient is achieving benefit   Adverse events/side effects summary: No adverse events/side effects   Patient's therapy is appropriate to: Continue    Goals Addressed             This Visit's Progress    Minimize recurrence of flares   On track    Patient is on track. Patient will maintain adherence         Follow up: 12 months  [facility]

## 2024-02-07 ENCOUNTER — Other Ambulatory Visit: Payer: Self-pay

## 2024-02-27 ENCOUNTER — Other Ambulatory Visit: Payer: Self-pay

## 2024-02-29 ENCOUNTER — Other Ambulatory Visit: Payer: Self-pay

## 2024-02-29 NOTE — Progress Notes (Signed)
 Specialty Pharmacy Refill Coordination Note  Gary Fischer is a 63 y.o. male contacted today regarding refills of specialty medication(s) Apremilast    Patient requested Delivery   Delivery date: 03/09/24   Verified address: [address] [place] [address]   Medication will be filled on 03/08/24.

## 2024-03-02 ENCOUNTER — Other Ambulatory Visit (HOSPITAL_COMMUNITY): Payer: Self-pay

## 2024-03-08 ENCOUNTER — Other Ambulatory Visit: Payer: Self-pay

## 2024-03-21 ENCOUNTER — Other Ambulatory Visit (HOSPITAL_COMMUNITY): Payer: Self-pay

## 2024-03-30 ENCOUNTER — Telehealth: Payer: Self-pay | Admitting: Pharmacist

## 2024-03-30 ENCOUNTER — Other Ambulatory Visit: Payer: Self-pay

## 2024-03-30 ENCOUNTER — Other Ambulatory Visit (HOSPITAL_COMMUNITY): Payer: Self-pay

## 2024-03-30 MED ORDER — OTEZLA 30 MG PO TABS: ORAL_TABLET | ORAL | 3 refills | Status: DC

## 2024-03-30 NOTE — Telephone Encounter (Signed)
 Called patient to schedule an appointment for the [facility] [employer] Specialty Medication Clinic. I was unable to reach the patient so I left a HIPAA-compliant message requesting that the patient return my call.   Butch Penny, PharmD, [place]y [place], CPP Clinical Pharmacist [facility] & [facility] [phone]

## 2024-04-03 ENCOUNTER — Other Ambulatory Visit: Payer: Self-pay

## 2024-04-03 ENCOUNTER — Ambulatory Visit: Attending: Internal Medicine | Admitting: Pharmacist

## 2024-04-03 DIAGNOSIS — Z79899 Other long term (current) drug therapy: Secondary | ICD-10-CM

## 2024-04-03 DIAGNOSIS — L409 Psoriasis, unspecified: Secondary | ICD-10-CM

## 2024-04-03 MED ORDER — OTEZLA 30 MG PO TABS
ORAL_TABLET | ORAL | 3 refills | Status: AC
  Filled 2024-04-03: qty 60, 30d supply, fill #0
  Filled 2024-05-07: qty 60, 30d supply, fill #1
  Filled 2024-06-05: qty 60, 30d supply, fill #2
  Filled 2024-07-05: qty 60, 30d supply, fill #3
  Filled 2024-08-03: qty 60, 30d supply, fill #4
  Filled 2024-08-31: qty 60, 30d supply, fill #5

## 2024-04-03 NOTE — Progress Notes (Signed)
 S: Patient presents for review of his medication.  Patient is currently taking Otezla  for psoriasis. Patient is managed by Dr. Junior for this.   Adherence: denies any recent missed doses.   Efficacy: reports decreased plaque thickness, itching, and scaling with Otezla . Still has some redness of skin but is happy with current control.   Monitoring: GI upset: nausea occasionally but not dictating therapy at this time. Helps to take with food. Neuropsychiatric changes: denies Weight: stable  Labs: WNL and monitored regularly  O:  Lab Results  Component Value Date   WBC 3.9 (L) 04/14/2023   HGB 14.4 04/14/2023   HCT 40.8 04/14/2023   MCV 90.9 04/14/2023   PLT 238 04/14/2023      Chemistry      Component Value Date/Time   NA 133 (L) 04/14/2023 0840   K 3.8 04/14/2023 0840   CL 100 04/14/2023 0840   CO2 27 04/14/2023 0840   BUN 14 04/14/2023 0840   CREATININE 0.85 04/14/2023 0840      Component Value Date/Time   CALCIUM  8.8 (L) 04/14/2023 0840   ALKPHOS 55 04/14/2023 0840   AST 23 04/14/2023 0840   ALT 21 04/14/2023 0840   BILITOT 0.8 04/14/2023 0840      A/P: 1. Medication review: patient on Otezla  and tolerating it well with decent control of psoriasis. Denies any side effects. No recommendations for any changes at this time.  Herlene Fleeta Morris, PharmD, JAQUELINE, CPP Clinical Pharmacist [facility] [phone]

## 2024-04-03 NOTE — Progress Notes (Signed)
 Specialty Pharmacy Refill Coordination Note  Gary Fischer is a 63 y.o. male contacted today regarding refills of specialty medication(s) Apremilast    Patient requested Delivery   Delivery date: 04/11/24   Verified address: [address] [place] [address]   Medication will be filled on 04/10/24.

## 2024-04-10 ENCOUNTER — Other Ambulatory Visit: Payer: Self-pay

## 2024-04-16 ENCOUNTER — Other Ambulatory Visit: Payer: Self-pay

## 2024-04-16 ENCOUNTER — Other Ambulatory Visit (HOSPITAL_COMMUNITY): Payer: Self-pay

## 2024-04-30 ENCOUNTER — Other Ambulatory Visit (HOSPITAL_COMMUNITY): Payer: Self-pay

## 2024-05-01 ENCOUNTER — Other Ambulatory Visit (HOSPITAL_COMMUNITY): Payer: Self-pay

## 2024-05-01 ENCOUNTER — Other Ambulatory Visit: Payer: Self-pay

## 2024-05-01 MED ORDER — TAMSULOSIN HCL 0.4 MG PO CAPS
0.4000 mg | ORAL_CAPSULE | Freq: Every day | ORAL | 4 refills | Status: AC
  Filled 2024-05-01: qty 90, 90d supply, fill #0
  Filled 2024-09-03: qty 90, 90d supply, fill #1

## 2024-05-07 ENCOUNTER — Other Ambulatory Visit: Payer: Self-pay | Admitting: Pharmacy Technician

## 2024-05-07 ENCOUNTER — Other Ambulatory Visit: Payer: Self-pay

## 2024-05-07 NOTE — Progress Notes (Signed)
 Specialty Pharmacy Refill Coordination Note  Gary Fischer is a 62 y.o. male contacted today regarding refills of specialty medication(s) Apremilast  (Otezla )   Patient requested Delivery   Delivery date: 05/11/24   Verified address: [address]   [place] [address]   Medication will be filled on 05/10/24.

## 2024-05-31 ENCOUNTER — Other Ambulatory Visit: Payer: Self-pay

## 2024-06-05 ENCOUNTER — Other Ambulatory Visit: Payer: Self-pay | Admitting: Pharmacy Technician

## 2024-06-05 ENCOUNTER — Other Ambulatory Visit: Payer: Self-pay

## 2024-06-05 NOTE — Progress Notes (Signed)
 Specialty Pharmacy Refill Coordination Note  Gary Fischer is a 63 y.o. male contacted today regarding refills of specialty medication(s) Apremilast  (Otezla )   Patient requested Delivery   Delivery date: 06/13/24   Verified address: [address]  [place]   Medication will be filled on 06/12/24.

## 2024-06-12 ENCOUNTER — Other Ambulatory Visit: Payer: Self-pay

## 2024-07-05 ENCOUNTER — Other Ambulatory Visit: Payer: Self-pay

## 2024-07-09 ENCOUNTER — Other Ambulatory Visit: Payer: Self-pay

## 2024-07-09 ENCOUNTER — Other Ambulatory Visit (HOSPITAL_COMMUNITY): Payer: Self-pay

## 2024-07-09 NOTE — Progress Notes (Signed)
 Specialty Pharmacy Refill Coordination Note  Gary Fischer is a 63 y.o. male contacted today regarding refills of specialty medication(s) Apremilast  (Otezla )   Patient requested Delivery   Delivery date: 07/12/24   Verified address: [address]  [place]   Medication will be filled on: 07/11/24

## 2024-07-10 ENCOUNTER — Other Ambulatory Visit: Payer: Self-pay

## 2024-07-23 ENCOUNTER — Other Ambulatory Visit (HOSPITAL_COMMUNITY): Payer: Self-pay

## 2024-07-24 ENCOUNTER — Other Ambulatory Visit (HOSPITAL_COMMUNITY): Payer: Self-pay

## 2024-07-24 ENCOUNTER — Other Ambulatory Visit: Payer: Self-pay

## 2024-07-24 MED ORDER — ROSUVASTATIN CALCIUM 40 MG PO TABS
40.0000 mg | ORAL_TABLET | Freq: Every day | ORAL | 4 refills | Status: AC
  Filled 2024-07-24: qty 90, 90d supply, fill #0

## 2024-08-03 ENCOUNTER — Other Ambulatory Visit: Payer: Self-pay

## 2024-08-06 ENCOUNTER — Other Ambulatory Visit: Payer: Self-pay

## 2024-08-06 NOTE — Progress Notes (Signed)
 Specialty Pharmacy Refill Coordination Note  Gary Fischer is a 63 y.o. male contacted today regarding refills of specialty medication(s) Apremilast  (Otezla )   Patient requested (Patient-Rptd) Delivery   Delivery date: 08/10/24   Verified address: (Patient-Rptd) [address] [place] [address]   Medication will be filled on: 08/09/24

## 2024-08-09 ENCOUNTER — Other Ambulatory Visit: Payer: Self-pay

## 2024-08-31 ENCOUNTER — Other Ambulatory Visit: Payer: Self-pay

## 2024-09-03 ENCOUNTER — Encounter (INDEPENDENT_AMBULATORY_CARE_PROVIDER_SITE_OTHER): Payer: Self-pay

## 2024-09-04 ENCOUNTER — Other Ambulatory Visit (HOSPITAL_COMMUNITY): Payer: Self-pay

## 2024-09-04 NOTE — Progress Notes (Signed)
 Specialty Pharmacy Refill Coordination Note  MyChart Questionnaire Submission  Gary Fischer is a 64 y.o. male contacted today regarding refills of specialty medication(s) Otezla .  Doses on hand: (Patient-Rptd) typically get filled to have at the 20th have around 10 days left   Patient requested: (Patient-Rptd) Delivery   Delivery date: 09/11/24  Verified address: [address] [place] [address]  Medication will be filled on 09/10/24

## 2024-11-05 ENCOUNTER — Inpatient Hospital Stay

## 2024-11-05 ENCOUNTER — Other Ambulatory Visit (HOSPITAL_COMMUNITY)

## 2024-11-13 ENCOUNTER — Ambulatory Visit: Admitting: Oncology
